# Patient Record
Sex: Male | Born: 1985 | Race: White | Hispanic: No | Marital: Single | Smoking: Never smoker
Health system: Southern US, Community
[De-identification: ages and names within clinical notes are randomized; demographics above are authoritative.]

## PROBLEM LIST (undated history)

## (undated) DIAGNOSIS — K7010 Alcoholic hepatitis without ascites: Secondary | ICD-10-CM

## (undated) DIAGNOSIS — F329 Major depressive disorder, single episode, unspecified: Secondary | ICD-10-CM

## (undated) DIAGNOSIS — R188 Other ascites: Secondary | ICD-10-CM

## (undated) DIAGNOSIS — R160 Hepatomegaly, not elsewhere classified: Secondary | ICD-10-CM

## (undated) DIAGNOSIS — F1020 Alcohol dependence, uncomplicated: Secondary | ICD-10-CM

---
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
## 2010-11-15 ENCOUNTER — Ambulatory Visit (HOSPITAL_BASED_OUTPATIENT_CLINIC_OR_DEPARTMENT_OTHER)
Admission: RE | Admit: 2010-11-15 | Discharge: 2010-11-15 | Disposition: A | Discharge status: Home / Self Care | Payer: 59 | Source: Ambulatory Visit | Attending: Orthopedic Surgery | Admitting: Orthopedic Surgery

## 2010-11-15 DIAGNOSIS — Y998 Other external cause status: Secondary | ICD-10-CM | POA: Insufficient documentation

## 2010-11-15 DIAGNOSIS — W19XXXA Unspecified fall, initial encounter: Secondary | ICD-10-CM | POA: Insufficient documentation

## 2010-11-15 DIAGNOSIS — Z01812 Encounter for preprocedural laboratory examination: Secondary | ICD-10-CM | POA: Insufficient documentation

## 2010-11-15 DIAGNOSIS — Y9374 Activity, frisbee: Secondary | ICD-10-CM | POA: Insufficient documentation

## 2010-11-15 DIAGNOSIS — S62339A Displaced fracture of neck of unspecified metacarpal bone, initial encounter for closed fracture: Secondary | ICD-10-CM | POA: Insufficient documentation

## 2010-11-15 HISTORY — PX: HAND SURGERY: SHX662

## 2010-11-15 LAB — POCT HEMOGLOBIN-HEMACUE: Hemoglobin: 18.3 g/dL — ABNORMAL HIGH (ref 13.0–17.0)

## 2010-11-16 NOTE — Op Note (Signed)
NAME:  [name], [name] NO.:  [id number]  MEDICAL RECORD NO.:  [id number]  LOCATION:                                 FACILITY:  PHYSICIAN:  Betha Loa, MD             DATE OF BIRTH:  DATE OF PROCEDURE:  11/15/2010 DATE OF DISCHARGE:                              OPERATIVE REPORT   PREOPERATIVE DIAGNOSIS:  Right small and ring finger metacarpal neck fractures.  POSTOPERATIVE DIAGNOSIS:  Right small and ring finger metacarpal neck fractures.  PROCEDURE:  Open reduction and percutaneous pinning of right small and ring finger metacarpal neck fracture.  SURGEON:  Betha Loa, MD  ASSISTANT:  Cindee Salt, MD  ANESTHESIA:  General with regional IV fluids per anesthesia flow sheet.  ESTIMATED BLOOD LOSS:  Minimal.  COMPLICATIONS:  None.  SPECIMENS:  None.  TOURNIQUET TIME:  88 minutes.  DISPOSITION:  Stable to PACU.  INDICATIONS:  Thomas Ross is a 25 year old male who 2 weeks ago was playing on ultimate frisbee when he fell on his hand injuring it.  He did seek immediate medical attention.  He presented to me yesterday in the office.  He had extensor lag at the small and ring finger MP joints. Radiographs revealed small and ring finger metacarpal neck fractures. There was volar angulation of these.  I discussed with Shamir and his mother the nature of his injury.  We discussed nonoperative treatment versus operative treatment with closed reduction and percutaneous pinning versus open reduction and internal fixation.  Risks, benefits, and alternatives of surgery were discussed and he wished to proceed. Risk of blood loss, infection, damage to nerves, vessels, tendons, ligaments, bone, failure of surgery, need for additional surgery, complications with wound healing, nonunion, malunion, stiffness.  He voiced understanding.  OPERATIVE COURSE:  After being identified preoperatively by myself, the patient and I agreed upon the procedure and site of procedure.   The surgical site was marked.  The risks, benefits, and alternatives of surgery were reviewed and he wished to proceed.  Surgical consent had been signed.  He had been given 1 gram of IV Ancef as preoperative antibiotic prophylaxis.  He was transferred to the operating room and placed on the operating table in supine position with the right upper extremity on an armboard.  General anesthesia was induced by anesthesiologist.  A regional block had been performed by Anesthesia in preoperative holding.  Right upper extremity was prepped and draped in normal sterile orthopedic fashion.  A surgical pause was performed between surgeons, Anesthesia, and operating staff and all were in agreement as to the patient, procedure, and site procedure.  Tourniquet proximal aspect of the extremity was inflated to 250 mmHg after exsanguination of the limb with an Esmarch bandage.  Closed reduction had been attempted after surgical pause, but prior to prepping and draping.  This was unable to gain any reduction.  A small incision was made between the ring and small finger metacarpals distally.  This was carried through subcutaneous tissues by spreading technique.  The extensor tendons were protected throughout the case.  They were not incised.  Periosteum was incised over the small finger metacarpal  neck distally.  This was then spread.  The fracture was freed up.  The same procedure was performed at the ring finger metacarpal neck.  Once the fractures had been freed up and we were able to be reduced under direct visualization using C-arm guidance, percutaneous pinning was performed. A 0.045-inch K-wires were used.  The ring finger was pinned first.  A cross-pinning fashion was used.  This was adequate to stabilize the ring finger metacarpal in adequate position.  Attention was turned to the small finger metacarpal.  This was again reduced.  It was difficult to hold reduced due to the obliquity of the  fracture.  Again, two 0.045- inch K-wires were used in a crossed fashion.  This was adequate to provide acceptable reduction.  The wrist was placed through tenodesis and there was no significant scissoring.  There had been noted to be some scarring between the extensor tendons and the periosteum.  This was freed up as well during the exposure.  The wound was copiously irrigated with sterile saline.  The periosteum was closed with 4-0 Vicryl in a figure-of-eight fashion.  The skin was closed with 4-0 nylon in a horizontal mattress fashion.  The pins were bent and cut short.  They were dressed with sterile Xeroform.  The wound was dressed with sterile Xeroform.  The wounds and pin sites were dressed with sterile 4x4s and wrapped with Kerlix.  A volar and dorsal slab splint was placed with the MPs flexed and IPs extended.  This was wrapped with Kerlix and Ace bandage.  The tourniquet was deflated at 88 minutes.  Fingertips were pink with brisk capillary refill after deflation of the tourniquet.  The operative drapes were broken down.  The patient was awoken from anesthesia safely.  He was transferred back to the stretcher and taken to PACU in stable condition.  I will see him back in the office in 1 week for postoperative followup.  I will give him a prescription for Percocet 5/325 one to two p.o. q.6 h. p.r.n. pain, dispensed #50.     Betha Loa, MD     KK/MEDQ  D:  11/15/2010  T:  11/15/2010  Job:  [id number]  Electronically Signed by Betha Loa  on 11/16/2010 02:50:06 PM

## 2014-09-04 DIAGNOSIS — F329 Major depressive disorder, single episode, unspecified: Secondary | ICD-10-CM

## 2014-09-04 DIAGNOSIS — K7010 Alcoholic hepatitis without ascites: Secondary | ICD-10-CM

## 2014-09-04 HISTORY — DX: Major depressive disorder, single episode, unspecified: F32.9

## 2014-09-04 HISTORY — DX: Alcoholic hepatitis without ascites: K70.10

## 2015-05-05 DIAGNOSIS — R188 Other ascites: Secondary | ICD-10-CM

## 2015-05-05 DIAGNOSIS — R160 Hepatomegaly, not elsewhere classified: Secondary | ICD-10-CM

## 2015-05-05 HISTORY — DX: Other ascites: R18.8

## 2015-05-05 HISTORY — DX: Hepatomegaly, not elsewhere classified: R16.0

## 2015-05-17 ENCOUNTER — Emergency Department (HOSPITAL_BASED_OUTPATIENT_CLINIC_OR_DEPARTMENT_OTHER): Payer: Self-pay

## 2015-05-17 ENCOUNTER — Encounter (HOSPITAL_BASED_OUTPATIENT_CLINIC_OR_DEPARTMENT_OTHER): Payer: Self-pay | Admitting: *Deleted

## 2015-05-17 ENCOUNTER — Inpatient Hospital Stay (HOSPITAL_BASED_OUTPATIENT_CLINIC_OR_DEPARTMENT_OTHER)
Admission: EM | Admit: 2015-05-17 | Discharge: 2015-05-21 | DRG: 433 | Disposition: A | Discharge status: Home / Self Care | Payer: Self-pay | Attending: Internal Medicine | Admitting: Internal Medicine

## 2015-05-17 DIAGNOSIS — B179 Acute viral hepatitis, unspecified: Secondary | ICD-10-CM | POA: Insufficient documentation

## 2015-05-17 DIAGNOSIS — D6489 Other specified anemias: Secondary | ICD-10-CM | POA: Diagnosis present

## 2015-05-17 DIAGNOSIS — R600 Localized edema: Secondary | ICD-10-CM | POA: Diagnosis present

## 2015-05-17 DIAGNOSIS — D689 Coagulation defect, unspecified: Secondary | ICD-10-CM | POA: Diagnosis present

## 2015-05-17 DIAGNOSIS — K7011 Alcoholic hepatitis with ascites: Principal | ICD-10-CM | POA: Diagnosis present

## 2015-05-17 DIAGNOSIS — Y900 Blood alcohol level of less than 20 mg/100 ml: Secondary | ICD-10-CM | POA: Diagnosis present

## 2015-05-17 DIAGNOSIS — R9431 Abnormal electrocardiogram [ECG] [EKG]: Secondary | ICD-10-CM | POA: Diagnosis present

## 2015-05-17 DIAGNOSIS — R601 Generalized edema: Secondary | ICD-10-CM

## 2015-05-17 DIAGNOSIS — I4581 Long QT syndrome: Secondary | ICD-10-CM

## 2015-05-17 DIAGNOSIS — F329 Major depressive disorder, single episode, unspecified: Secondary | ICD-10-CM | POA: Diagnosis present

## 2015-05-17 DIAGNOSIS — F1020 Alcohol dependence, uncomplicated: Secondary | ICD-10-CM | POA: Diagnosis present

## 2015-05-17 DIAGNOSIS — E871 Hypo-osmolality and hyponatremia: Secondary | ICD-10-CM | POA: Diagnosis present

## 2015-05-17 DIAGNOSIS — K7010 Alcoholic hepatitis without ascites: Secondary | ICD-10-CM | POA: Diagnosis present

## 2015-05-17 DIAGNOSIS — D72829 Elevated white blood cell count, unspecified: Secondary | ICD-10-CM | POA: Diagnosis present

## 2015-05-17 DIAGNOSIS — T502X5A Adverse effect of carbonic-anhydrase inhibitors, benzothiadiazides and other diuretics, initial encounter: Secondary | ICD-10-CM | POA: Diagnosis present

## 2015-05-17 DIAGNOSIS — K7460 Unspecified cirrhosis of liver: Secondary | ICD-10-CM | POA: Diagnosis present

## 2015-05-17 DIAGNOSIS — D649 Anemia, unspecified: Secondary | ICD-10-CM | POA: Diagnosis present

## 2015-05-17 DIAGNOSIS — E876 Hypokalemia: Secondary | ICD-10-CM

## 2015-05-17 DIAGNOSIS — R188 Other ascites: Secondary | ICD-10-CM | POA: Diagnosis present

## 2015-05-17 DIAGNOSIS — E8809 Other disorders of plasma-protein metabolism, not elsewhere classified: Secondary | ICD-10-CM | POA: Diagnosis present

## 2015-05-17 DIAGNOSIS — J9811 Atelectasis: Secondary | ICD-10-CM | POA: Diagnosis present

## 2015-05-17 HISTORY — DX: Major depressive disorder, single episode, unspecified: F32.9

## 2015-05-17 HISTORY — DX: Other ascites: R18.8

## 2015-05-17 HISTORY — DX: Alcohol dependence, uncomplicated: F10.20

## 2015-05-17 HISTORY — DX: Alcoholic hepatitis without ascites: K70.10

## 2015-05-17 HISTORY — DX: Hepatomegaly, not elsewhere classified: R16.0

## 2015-05-17 LAB — CBC WITH DIFFERENTIAL/PLATELET
BASOS ABS: 0 10*3/uL (ref 0.0–0.1)
Basophils Relative: 0 %
Eosinophils Absolute: 0 10*3/uL (ref 0.0–0.7)
Eosinophils Relative: 0 %
HEMATOCRIT: 37 % — AB (ref 39.0–52.0)
HEMOGLOBIN: 14.1 g/dL (ref 13.0–17.0)
LYMPHS PCT: 9 %
Lymphs Abs: 1.4 10*3/uL (ref 0.7–4.0)
MCH: 36.2 pg — ABNORMAL HIGH (ref 26.0–34.0)
MCHC: 38.1 g/dL — ABNORMAL HIGH (ref 30.0–36.0)
MCV: 94.9 fL (ref 78.0–100.0)
MONO ABS: 1.7 10*3/uL — AB (ref 0.1–1.0)
Monocytes Relative: 11 %
NEUTROS ABS: 12 10*3/uL — AB (ref 1.7–7.7)
NEUTROS PCT: 79 %
Platelets: 169 10*3/uL (ref 150–400)
RBC: 3.9 MIL/uL — AB (ref 4.22–5.81)
RDW: 13.7 % (ref 11.5–15.5)
WBC: 15.1 10*3/uL — ABNORMAL HIGH (ref 4.0–10.5)

## 2015-05-17 LAB — URINE MICROSCOPIC-ADD ON
RBC / HPF: NONE SEEN RBC/hpf (ref 0–5)
SQUAMOUS EPITHELIAL / LPF: NONE SEEN

## 2015-05-17 LAB — COMPREHENSIVE METABOLIC PANEL
ALK PHOS: 237 U/L — AB (ref 38–126)
ALT: 55 U/L (ref 17–63)
AST: 266 U/L — AB (ref 15–41)
Albumin: 2.5 g/dL — ABNORMAL LOW (ref 3.5–5.0)
Anion gap: 15 (ref 5–15)
BILIRUBIN TOTAL: 5.9 mg/dL — AB (ref 0.3–1.2)
CHLORIDE: 85 mmol/L — AB (ref 101–111)
CO2: 25 mmol/L (ref 22–32)
CREATININE: 0.36 mg/dL — AB (ref 0.61–1.24)
Calcium: 6.7 mg/dL — ABNORMAL LOW (ref 8.9–10.3)
Glucose, Bld: 140 mg/dL — ABNORMAL HIGH (ref 65–99)
Potassium: 2.2 mmol/L — CL (ref 3.5–5.1)
Sodium: 125 mmol/L — ABNORMAL LOW (ref 135–145)
Total Protein: 6.4 g/dL — ABNORMAL LOW (ref 6.5–8.1)

## 2015-05-17 LAB — URINALYSIS, ROUTINE W REFLEX MICROSCOPIC
Glucose, UA: NEGATIVE mg/dL
Hgb urine dipstick: NEGATIVE
KETONES UR: NEGATIVE mg/dL
NITRITE: NEGATIVE
Protein, ur: NEGATIVE mg/dL
Specific Gravity, Urine: 1.019 (ref 1.005–1.030)
pH: 6.5 (ref 5.0–8.0)

## 2015-05-17 LAB — PROTIME-INR
INR: 1.32 (ref 0.00–1.49)
Prothrombin Time: 16.5 seconds — ABNORMAL HIGH (ref 11.6–15.2)

## 2015-05-17 LAB — BRAIN NATRIURETIC PEPTIDE: B NATRIURETIC PEPTIDE 5: 166.1 pg/mL — AB (ref 0.0–100.0)

## 2015-05-17 LAB — MAGNESIUM: MAGNESIUM: 1.4 mg/dL — AB (ref 1.7–2.4)

## 2015-05-17 LAB — TROPONIN I: Troponin I: 0.03 ng/mL (ref <0.031)

## 2015-05-17 LAB — AMMONIA: AMMONIA: 101 umol/L — AB (ref 9–35)

## 2015-05-17 LAB — ETHANOL: ALCOHOL ETHYL (B): 5 mg/dL — AB (ref <5)

## 2015-05-17 MED ORDER — LORAZEPAM 1 MG PO TABS
0.0000 mg | ORAL_TABLET | Freq: Four times a day (QID) | ORAL | Status: AC
  Administered 2015-05-18 (×2): 1 mg via ORAL
  Filled 2015-05-17 (×2): qty 1

## 2015-05-17 MED ORDER — SODIUM CHLORIDE 0.9% FLUSH
3.0000 mL | Freq: Two times a day (BID) | INTRAVENOUS | Status: DC
  Administered 2015-05-18 – 2015-05-21 (×6): 3 mL via INTRAVENOUS

## 2015-05-17 MED ORDER — POLYETHYLENE GLYCOL 3350 17 G PO PACK: 17.0000 g | PACK | Freq: Every day | ORAL | Status: DC | PRN

## 2015-05-17 MED ORDER — THIAMINE HCL 100 MG/ML IJ SOLN
100.0000 mg | Freq: Every day | INTRAMUSCULAR | Status: DC
  Filled 2015-05-17 (×5): qty 1

## 2015-05-17 MED ORDER — VITAMIN B-1 100 MG PO TABS
100.0000 mg | ORAL_TABLET | Freq: Every day | ORAL | Status: DC
Start: 2015-05-17 — End: 2015-05-21
  Administered 2015-05-17 – 2015-05-21 (×5): 100 mg via ORAL
  Filled 2015-05-17 (×5): qty 1

## 2015-05-17 MED ORDER — SODIUM CHLORIDE 0.9 % IV SOLN
INTRAVENOUS | Status: DC
  Administered 2015-05-17: 16:00:00 via INTRAVENOUS

## 2015-05-17 MED ORDER — BISACODYL 5 MG PO TBEC: 5.0000 mg | DELAYED_RELEASE_TABLET | Freq: Every day | ORAL | Status: DC | PRN

## 2015-05-17 MED ORDER — ONDANSETRON HCL 4 MG/2ML IJ SOLN: 4.0000 mg | Freq: Four times a day (QID) | INTRAMUSCULAR | Status: DC | PRN

## 2015-05-17 MED ORDER — ONDANSETRON HCL 4 MG PO TABS: 4.0000 mg | ORAL_TABLET | Freq: Four times a day (QID) | ORAL | Status: DC | PRN

## 2015-05-17 MED ORDER — MAGNESIUM SULFATE 2 GM/50ML IV SOLN
2.0000 g | Freq: Once | INTRAVENOUS | Status: AC
  Administered 2015-05-17: 2 g via INTRAVENOUS
  Filled 2015-05-17: qty 50

## 2015-05-17 MED ORDER — LORAZEPAM 1 MG PO TABS: 1.0000 mg | ORAL_TABLET | Freq: Four times a day (QID) | ORAL | Status: AC | PRN

## 2015-05-17 MED ORDER — POTASSIUM CHLORIDE 10 MEQ/100ML IV SOLN
10.0000 meq | INTRAVENOUS | Status: AC
  Administered 2015-05-17 – 2015-05-18 (×4): 10 meq via INTRAVENOUS
  Filled 2015-05-17 (×3): qty 100

## 2015-05-17 MED ORDER — ACETAMINOPHEN 650 MG RE SUPP: 650.0000 mg | Freq: Four times a day (QID) | RECTAL | Status: DC | PRN

## 2015-05-17 MED ORDER — ADULT MULTIVITAMIN W/MINERALS CH
1.0000 | ORAL_TABLET | Freq: Every day | ORAL | Status: DC
  Administered 2015-05-17 – 2015-05-21 (×5): 1 via ORAL
  Filled 2015-05-17 (×5): qty 1

## 2015-05-17 MED ORDER — IOHEXOL 350 MG/ML SOLN
85.0000 mL | Freq: Once | INTRAVENOUS | Status: AC | PRN
  Administered 2015-05-17: 85 mL via INTRAVENOUS

## 2015-05-17 MED ORDER — LORAZEPAM 1 MG PO TABS: 0.0000 mg | ORAL_TABLET | Freq: Two times a day (BID) | ORAL | Status: DC

## 2015-05-17 MED ORDER — FUROSEMIDE 20 MG PO TABS
20.0000 mg | ORAL_TABLET | Freq: Once | ORAL | Status: AC
  Administered 2015-05-17: 20 mg via ORAL
  Filled 2015-05-17: qty 1

## 2015-05-17 MED ORDER — LORAZEPAM 2 MG/ML IJ SOLN
1.0000 mg | Freq: Four times a day (QID) | INTRAMUSCULAR | Status: AC | PRN
  Administered 2015-05-18: 1 mg via INTRAVENOUS
  Filled 2015-05-17: qty 1

## 2015-05-17 MED ORDER — OXYCODONE HCL 5 MG PO TABS
5.0000 mg | ORAL_TABLET | ORAL | Status: DC | PRN
  Administered 2015-05-17 – 2015-05-21 (×9): 5 mg via ORAL
  Filled 2015-05-17 (×9): qty 1

## 2015-05-17 MED ORDER — SPIRONOLACTONE 50 MG PO TABS
50.0000 mg | ORAL_TABLET | Freq: Once | ORAL | Status: AC
  Administered 2015-05-17: 50 mg via ORAL
  Filled 2015-05-17: qty 1

## 2015-05-17 MED ORDER — POTASSIUM CHLORIDE 10 MEQ/100ML IV SOLN
10.0000 meq | Freq: Once | INTRAVENOUS | Status: AC
  Administered 2015-05-17: 10 meq via INTRAVENOUS
  Filled 2015-05-17: qty 100

## 2015-05-17 MED ORDER — HYDROMORPHONE HCL 1 MG/ML IJ SOLN: 0.5000 mg | INTRAMUSCULAR | Status: DC | PRN

## 2015-05-17 MED ORDER — FOLIC ACID 1 MG PO TABS
1.0000 mg | ORAL_TABLET | Freq: Every day | ORAL | Status: DC
  Administered 2015-05-17 – 2015-05-21 (×5): 1 mg via ORAL
  Filled 2015-05-17 (×5): qty 1

## 2015-05-17 MED ORDER — ENOXAPARIN SODIUM 40 MG/0.4ML ~~LOC~~ SOLN
40.0000 mg | SUBCUTANEOUS | Status: DC
  Administered 2015-05-17 – 2015-05-20 (×4): 40 mg via SUBCUTANEOUS
  Filled 2015-05-17 (×4): qty 0.4

## 2015-05-17 MED ORDER — IOHEXOL 300 MG/ML  SOLN
25.0000 mL | Freq: Once | INTRAMUSCULAR | Status: AC | PRN
  Administered 2015-05-17: 25 mL via ORAL

## 2015-05-17 MED ORDER — FAMOTIDINE IN NACL 20-0.9 MG/50ML-% IV SOLN
20.0000 mg | Freq: Two times a day (BID) | INTRAVENOUS | Status: DC
  Administered 2015-05-17: 20 mg via INTRAVENOUS
  Filled 2015-05-17 (×2): qty 50

## 2015-05-17 MED ORDER — ACETAMINOPHEN 325 MG PO TABS: 650.0000 mg | ORAL_TABLET | Freq: Four times a day (QID) | ORAL | Status: DC | PRN

## 2015-05-17 NOTE — ED Notes (Signed)
[facility] is transferring patient to [facility] room [phone]

## 2015-05-17 NOTE — ED Notes (Signed)
Pt c/o burning at site. Rate decreased to 75 ml/hr.

## 2015-05-17 NOTE — ED Notes (Addendum)
Pt reports 4-5 days of bilateral leg swelling and abdominal swelling. Denies past medical hx. Tachycardic in triage. Feet and abdomen noticeably swollen

## 2015-05-17 NOTE — ED Notes (Signed)
Dr. Judd Lienelo updated on pt condition

## 2015-05-17 NOTE — Progress Notes (Signed)
[facility] of alcohol related hepatitis, LE swelling, abd distention Needs telemetry monitoring due to hypokalemia [facility] consulted  Eston EstersAlma DevineTRH [phone]

## 2015-05-17 NOTE — ED Provider Notes (Signed)
CSN: [id number]     Arrival date & time 05/17/15  1023 History   First MD Initiated Contact with Patient 05/17/15 1052     Chief Complaint  Patient presents with  . Leg Swelling     (Consider location/radiation/quality/duration/timing/severity/associated sxs/prior Treatment) HPI Comments: Patient is a 30 year old male with no significant past medical history. He presents today for evaluation of bilateral leg swelling, abdominal distention, and shortness of breath which has worsened over the past several days. He reports viral type symptoms preceding the onset of today's presentation. He states he was sick last week with GI like illness. He denies any chest pain. He denies any fevers or chills. He denies any changes in his bowel or bladder habits.  The history is provided by the patient.    History reviewed. No pertinent past medical history. Past Surgical History  Procedure Laterality Date  . Hand surgery     No family history on file. Social History  Substance Use Topics  . Smoking status: Never Smoker   . Smokeless tobacco: None  . Alcohol Use: No    Review of Systems  All other systems reviewed and are negative.     Allergies  Review of patient's allergies indicates no known allergies.  Home Medications   Prior to Admission medications   Not on File   BP 148/90 mmHg  Pulse 124  Temp(Src) 98.6 F (37 C) (Oral)  Resp 18  Ht  (1.854 m)  Wt 220 lb (99.791 kg)  BMI 29.03 kg/m2  SpO2 99% Physical Exam  Constitutional: He is oriented to person, place, and time. He appears well-developed and well-nourished. No distress.  HENT:  Head: Normocephalic and atraumatic.  Neck: Normal range of motion. Neck supple.  Cardiovascular: Regular rhythm and normal heart sounds.   No murmur heard. Heart is tachycardic, but regular.  Pulmonary/Chest: Effort normal. No respiratory distress. He has no wheezes. He has rales.  There are slight rales in the bases bilaterally.   Abdominal: Soft. Bowel sounds are normal. He exhibits distension. There is no tenderness. There is no rebound.  Abdomen feels somewhat distended. There is no tenderness to palpation.  Musculoskeletal: Normal range of motion. He exhibits edema.  There is 2-3+ pitting edema of the bilateral lower extremities. Pulses are easily palpable.  Neurological: He is alert and oriented to person, place, and time.  Skin: Skin is warm and dry. He is not diaphoretic.  Nursing note and vitals reviewed.   ED Course  Procedures (including critical care time) Labs Review Labs Reviewed  COMPREHENSIVE METABOLIC PANEL  TROPONIN I  BRAIN NATRIURETIC PEPTIDE  CBC WITH DIFFERENTIAL/PLATELET    Imaging Review No results found. I have personally reviewed and evaluated these images and lab results as part of my medical decision-making.   EKG Interpretation   Date/Time:  Monday May 17 2015 10:47:46 EDT Ventricular Rate:  120 PR Interval:  141 QRS Duration: 98 QT Interval:  361 QTC Calculation: 510 R Axis:   70 Text Interpretation:  Sinus tachycardia Prolonged QT interval Baseline  wander in lead(s) V6 Confirmed by Graysen Woodyard  MD, Gyneth Hubka ([id number]) on 05/17/2015  11:34:52 AM      MDM   Final diagnoses:  None    Patient with history of alcoholism. He presents with complaints of leg and abdominal swelling. His workup reveals an elevated bilirubin, hyponatremia, and hypo-hypokalemia. CT scan was obtained of the abdomen and pelvis which reveals an inflamed liver with narrowing of the IVC which appears to  be causing his edema and ascites. This finding was discussed with Jacki ConesLaurie from [facility]. She is recommending admission to the hospitalist service. Patient will be given potassium replacement and started on IV hydration to correct his hyponatremia. [facility].    Geoffery Lyonsouglas Delisia Mcquiston, MD 05/17/15 [phone]

## 2015-05-17 NOTE — H&P (Signed)
[facility] History and Physical  Thomas Ross WJX:[id number] DOB: September 11, 1985 DOA: 05/17/2015  Referring physician: ED physician PCP: No primary care provider on file.  Specialists:  None listed  Chief Complaint:  Abdominal and bilateral leg swelling   HPI: Thomas Ross is a 30 y.o. male with PMH of alcohol abuse who presents in transfer from [facility] with 4-5 days of bilateral lower extremity and abdominal swelling. Patient endorses a 7-8 year history of heavy alcohol use, typically consuming about 30 standard drinks per day. He had enrolled in an inpatient rehabilitation program approximately 6 months ago, but unfortunately returned to drinking. Blood work was obtained at the inpatient facility 6 months ago and the patient recalls being diagnosed with "mild cirrhosis." Patient denies any recent sick contacts, long distance travel, fever, or chills. He notes onset of loose stools approximately 3 days ago, but denies melena or hematochezia. He endorses mild, generalized abdominal tenderness, but denies pain per se. There's been no change in his urine; specifically, there is been no noted darkening or decreased output. He has experienced shaking, sweating, and anxiety when abstaining from alcohol, but denies any history of seizure. He denies use of intravenous drugs, or any other illicit substances. He denies personal or family history of viral hepatitis. Patient endorses a strong desire for achieving alcohol abstinence. He believes that his drinking is an attempt to manage undiagnosed depression.  In [facility] ED, patient was found toBe afebrile, saturating well on room air, tachycardic to the 120s, and with vitals otherwise stable. CMP is profoundly deranged, with sodium 125, potassium 2.2, albumin 2.5, and total bilirubin 5.9. Renal function appears normal. CBC features a leukocytosis to 15,100 and mild normocytic anemia with target cells identified on smear. BNP is elevated 266 and INR is 1.32. EKG  features a sinus tachycardia with rate of 120 and QT interval prolonged to 510 ms. Chest x-ray demonstrates bibasilar atelectasis. CT of the abdomen and pelvis features and enlarged, markedly heterogenous inflamed fatty liver. Liver inflammation is noted to cause narrowing of the hepatic course of the IVC. Patient was given 10 mEq of IV potassium in the emergency department and started on normal saline infusion. He was accepted in transfer to [facility] for ongoing evaluation and management.   Patient is interviewed and examined on the telemetry unit at [facility] with his mother at the bedside providing additional history. He will be admitted for ongoing evaluation and management of hepatitis, suspected to be alcoholic in origin and worsening acutely.    Where does patient live?   At home     Can patient participate in ADLs?  Yes       Review of Systems:   General: no fevers, chills, sweats, weight change, poor appetite, or fatigue HEENT: no blurry vision, hearing changes or sore throat Pulm: no dyspnea, cough, or wheeze CV: no chest pain or palpitations Abd: no vomiting or constipation. Nausea, mild abd pain, diarrhea GU: no dysuria, hematuria, increased urinary frequency, or urgency  Ext:  Bilateral LE edema  Neuro: no focal weakness, numbness, or tingling, no vision change or hearing loss Skin: no rash, no wounds MSK: No muscle spasm, no deformity, no red, hot, or swollen joint Heme: No easy bruising or bleeding Travel history: No recent long distant travel    Allergy: No Known Allergies  Past Medical History  Diagnosis Date  . Alcohol abuse     11/2014 30 can beer per day.    Past Surgical History  Procedure Laterality Date  .  Hand surgery      Social History:  reports that he has never smoked. He does not have any smokeless tobacco history on file. He reports that he does not drink alcohol or use illicit drugs.  Family History:  Family History  Problem Relation Age of  Onset  . Alcoholism Neg Hx      Prior to Admission medications   Medication Sig Start Date End Date Taking? Authorizing Provider  Bisacodyl (LAXATIVE PO) Take 1 tablet by mouth daily as needed (constipation).   Yes Historical Provider, MD  Chlorphen-Pseudoephed-APAP (THERAFLU FLU/COLD PO) Take 1 each by mouth every 8 (eight) hours as needed (cold symptoms).   Yes Historical Provider, MD    Physical Exam: Filed Vitals:   05/17/15 1420 05/17/15 1500 05/17/15 1542 05/17/15 1714  BP: 131/92 118/86 117/95 128/77  Pulse: 120 118 112 118  Temp:   98.5 F (36.9 C) 97.5 F (36.4 C)  TempSrc:   Oral Oral  Resp: Height:     (1.854 m)  Weight:    111.7 kg (246 lb 4.1 oz)  SpO2: 98% 98% 97% 99%   General: Not in acute distress, edematous HEENT:       Eyes: PERRL, EOMI, no conjunctival pallor. Icteric sclerae       ENT: No discharge from the ears or nose, no pharyngeal ulcers, petechiae or exudate, no tonsillar enlargement.        Neck: No JVD, no bruit, no appreciable mass Heme: No cervical adenopathy, no pallor Cardiac: Rate ~120 and regular with grade III holosystolic murmur at LSB, No gallops or rubs. Pulm: Good air movement bilaterally. No rales, wheezing, rhonchi or rubs. Abd: Soft, mild distension, mild tenderness, no rebound pain or gaurding, no mass or organomegaly, BS present, purple striae. Ext:  Bilateral LEs edematous to thigh. 2+DP/PT pulse bilaterally. Musculoskeletal: No gross deformity, no red, hot, swollen joints   Skin: No rashes or wounds on exposed surfaces  Neuro: Alert, oriented X3, cranial nerves II-XII grossly intact. No focal findings Psych: Patient is not overtly psychotic, appropriate mood and affect.  Labs on Admission:  Basic Metabolic Panel:  Recent Labs Lab 05/17/15 1105  NA 125*  K 2.2*  CL 85*  CO2 25  GLUCOSE 140*  BUN <5*  CREATININE 0.36*  CALCIUM 6.7*   Liver Function Tests:  Recent Labs Lab 05/17/15 1105  AST 266*   ALT 55  ALKPHOS 237*  BILITOT 5.9*  PROT 6.4*  ALBUMIN 2.5*   No results for input(s): LIPASE, AMYLASE in the last 168 hours. No results for input(s): AMMONIA in the last 168 hours. CBC:  Recent Labs Lab 05/17/15 1105  WBC 15.1*  NEUTROABS 12.0*  HGB 14.1  HCT 37.0*  MCV 94.9  PLT 169   Cardiac Enzymes:  Recent Labs Lab 05/17/15 1105  TROPONINI 0.03    BNP (last 3 results)  Recent Labs  05/17/15 1105  BNP 166.1*    ProBNP (last 3 results) No results for input(s): PROBNP in the last 8760 hours.  CBG: No results for input(s): GLUCAP in the last 168 hours.  Radiological Exams on Admission: Dg Chest 2 View  05/17/2015  CLINICAL DATA:  Abdominal bloating and shortness of breath for 5 days, some diarrhea EXAM: CHEST  2 VIEW COMPARISON:  None FINDINGS: Upper normal heart size. Mediastinal contours and pulmonary vascularity normal. Subsegmental atelectasis RIGHT base with mild eventration of the anterior RIGHT diaphragm. Minimal subsegmental atelectasis LEFT base. Upper  lungs clear. No pleural effusion or pneumothorax. IMPRESSION: Bibasilar atelectasis greater on RIGHT. Electronically Signed   By: Ulyses Southward M.D.   On: 05/17/2015 11:58   Ct Abdomen Pelvis W Contrast  05/17/2015  ADDENDUM REPORT: 05/17/2015 17:45 ADDENDUM: Adenopathy largest portacaval region with small lymph nodes gastrohepatic, peripancreatic and mesenteric region may be reactive in origin and can be assessed on followup liver MR after acute episode has been addressed. Electronically Signed   By: Lacy Duverney M.D.   On: 05/17/2015 17:45  05/17/2015  CLINICAL DATA:  30 year old male with 4-5 day history of abdominal swelling and foot swelling. Initial encounter. EXAM: CT ABDOMEN AND PELVIS WITH CONTRAST TECHNIQUE: Multidetector CT imaging of the abdomen and pelvis was performed using the standard protocol following bolus administration of intravenous contrast. CONTRAST:  25mL OMNIPAQUE IOHEXOL 300 MG/ML  SOLN, 85mL OMNIPAQUE IOHEXOL 350 MG/ML SOLN COMPARISON:  None. FINDINGS: Enlarged markedly heterogeneous inflamed fatty liver. Findings consistent with steatohepatitis of indeterminate etiology. This is causing and narrowing of the hepatic course of the inferior vena cava (primary Budd-Chiari syndrome felt to be unlikely as cause for this finding) contributing to the patient's leg swelling. Clinical and laboratory correlation recommended to determine if hepatitis is related to alcohol/ medication ingestion versus viral/inflammatory in origin. After acute episode has been addressed, patient would benefit from follow-up liver MR to exclude the possibly of underlying mass. Early cirrhosis not excluded given the configuration of the liver (caudate/ lateral left lobe enlargement). Ascites. This limits evaluation of bowel however, no primary bowel inflammatory process or free intraperitoneal air is noted. No cardiac enlargement to suggest cardiac cause of liver abnormality. No splenic enlargement. Main portal vein and splenic vein appear patent. No worrisome splenic, pancreatic, renal or adrenal abnormality. Evaluation of the gallbladder is limited by the surrounding ascites and liver inflammation. No acute osseous abnormality.  L5-S1 degenerative changes. Mild third spacing of fluid subcutaneous region. Lung bases clear. IMPRESSION: Enlarged markedly heterogeneous inflamed fatty liver. Findings consistent with steatohepatitis of indeterminate etiology. This is causing and narrowing of the hepatic course of the inferior vena cava contributing to the patient's leg swelling. Clinical and laboratory correlation recommended to determine if hepatitis is related to alcohol/ medication ingestion versus viral/inflammatory in origin. After acute episode has been addressed, patient would benefit from follow-up liver MR to exclude the possibly of underlying mass. Early cirrhosis not excluded given the configuration of the liver  (caudate/ lateral left lobe enlargement). Ascites. Evaluation of the gallbladder is limited by the surrounding ascites and liver inflammation. Mild third spacing of fluid subcutaneous region. Electronically Signed: By: Lacy Duverney M.D. On: 05/17/2015 14:00    EKG: Independently reviewed.  Abnormal findings:   Sinus tachycardia, rate 120, QTc 510 ms   Assessment/Plan  1. Alcoholic hepatitis  - Consuming ~30 drinks/day x7-8 yrs  - Pt reports blood work 6 mos ago, was told he had mild cirrhosis at that time  - Now presenting with total bili 5.9, INR 1.32, albumin 2.2, sodium 125, potassium 2.2, and anasarca  - Suspected progression of chronic alcoholic liver disease; superimposed acute viral hepatitis possible, will check viral Hep panel - Discussed with pt that abstinence is the cornerstone of treatment  - Maddrey discriminant function score is 27 on admission; will hold off on starting prednisolone unless worsens to 32 or more  - Correcting associated electrolyte derangements; check magnesium and replete prn   2. Alcoholism  - Pt reports consumption of ~30 drinks/day for 7-8 yrs  -  Entered inpt rehab ~6 mos ago but unfortunately, returned to drinking  - Pt cites depression as reason for drinking, is open to discussing this further, starting medication if indicated  - Endorses hx of sweats and shakes with abstinence, denies szr hx  - Monitor on CIWA with prn Ativan  - Social work consultation requested  3. Anasarca  - There is marked edema of LEs to thigh; ascites noted - Secondary to liver disease, albumin 2.5, low effective circulating arterial volume   - No significant pulmonary edema and no resp distress - Will aim for slow fluid removal to avoid precipitating renal insult  - After replacing potassium, will give spironolactone 50 mg and Lasix 20 mg PO x1  - Consider continuing spironolactone and Lasix at 100:40 ratio  - Follow renal function and electrolytes closely  - SLIV, daily  wts, strict I/Os, fluid-restrict diet, salt restrictions   4. Prolonged QTc interval  - Likely secondary to hypokalemia  - Expect resolution with K+ replacement  - Check mag level and replete prn  - Monitor on telemetry  - Avoid drugs that would further prolong    5. Depression - Pt cites this as reason for drinking  - He is willing to discuss further and start medications if indicated  - SW consultation requested for eval     DVT ppx:  SQ Lovenox      Code Status: Full code Family Communication:  Yes, patient's mother at bed side Disposition Plan: Admit to inpatient   Date of Service 05/17/2015    Briscoe Deutscherimothy S Arliss Hepburn, MD [facility] Pager [phone]  If 7PM-7AM, please contact night-coverage [email or web address] Password [email or web address] 05/17/2015, 7:29 PM

## 2015-05-18 ENCOUNTER — Encounter (HOSPITAL_COMMUNITY): Payer: Self-pay | Admitting: Physician Assistant

## 2015-05-18 DIAGNOSIS — R188 Other ascites: Secondary | ICD-10-CM

## 2015-05-18 DIAGNOSIS — R600 Localized edema: Secondary | ICD-10-CM

## 2015-05-18 LAB — CBC WITH DIFFERENTIAL/PLATELET
BASOS ABS: 0 10*3/uL (ref 0.0–0.1)
BASOS PCT: 0 %
EOS PCT: 0 %
Eosinophils Absolute: 0 10*3/uL (ref 0.0–0.7)
HCT: 37.8 % — ABNORMAL LOW (ref 39.0–52.0)
Hemoglobin: 13.3 g/dL (ref 13.0–17.0)
LYMPHS PCT: 13 %
Lymphs Abs: 1.7 10*3/uL (ref 0.7–4.0)
MCH: 35.3 pg — ABNORMAL HIGH (ref 26.0–34.0)
MCHC: 35.2 g/dL (ref 30.0–36.0)
MCV: 100.3 fL — AB (ref 78.0–100.0)
MONO ABS: 1.7 10*3/uL — AB (ref 0.1–1.0)
Monocytes Relative: 13 %
NEUTROS ABS: 9.4 10*3/uL — AB (ref 1.7–7.7)
Neutrophils Relative %: 74 %
PLATELETS: 158 10*3/uL (ref 150–400)
RBC: 3.77 MIL/uL — AB (ref 4.22–5.81)
RDW: 14.9 % (ref 11.5–15.5)
WBC: 12.8 10*3/uL — AB (ref 4.0–10.5)

## 2015-05-18 LAB — POTASSIUM: Potassium: 3.3 mmol/L — ABNORMAL LOW (ref 3.5–5.1)

## 2015-05-18 LAB — COMPREHENSIVE METABOLIC PANEL
ALBUMIN: 2.2 g/dL — AB (ref 3.5–5.0)
ALK PHOS: 212 U/L — AB (ref 38–126)
ALT: 51 U/L (ref 17–63)
ANION GAP: 13 (ref 5–15)
AST: 240 U/L — ABNORMAL HIGH (ref 15–41)
BILIRUBIN TOTAL: 6.9 mg/dL — AB (ref 0.3–1.2)
CALCIUM: 7 mg/dL — AB (ref 8.9–10.3)
CO2: 29 mmol/L (ref 22–32)
Chloride: 87 mmol/L — ABNORMAL LOW (ref 101–111)
Creatinine, Ser: 0.36 mg/dL — ABNORMAL LOW (ref 0.61–1.24)
GFR calc Af Amer: >60 mL/min (ref >60)
Glucose, Bld: 89 mg/dL (ref 65–99)
POTASSIUM: 2.4 mmol/L — AB (ref 3.5–5.1)
SODIUM: 129 mmol/L — AB (ref 135–145)
TOTAL PROTEIN: 5.8 g/dL — AB (ref 6.5–8.1)

## 2015-05-18 LAB — RAPID URINE DRUG SCREEN, HOSP PERFORMED
Amphetamines: NOT DETECTED
Barbiturates: NOT DETECTED
Benzodiazepines: NOT DETECTED
COCAINE: NOT DETECTED
OPIATES: NOT DETECTED
TETRAHYDROCANNABINOL: NOT DETECTED

## 2015-05-18 LAB — GLUCOSE, CAPILLARY: GLUCOSE-CAPILLARY: 90 mg/dL (ref 65–99)

## 2015-05-18 LAB — PROTIME-INR
INR: 1.44 (ref 0.00–1.49)
Prothrombin Time: 17.1 seconds — ABNORMAL HIGH (ref 11.6–15.2)

## 2015-05-18 LAB — HEPATITIS PANEL, ACUTE
HCV Ab: <0.1 s/co ratio (ref 0.0–0.9)
HEP B C IGM: NEGATIVE
Hep A IgM: NEGATIVE
Hepatitis B Surface Ag: NEGATIVE

## 2015-05-18 MED ORDER — SPIRONOLACTONE 100 MG PO TABS
100.0000 mg | ORAL_TABLET | Freq: Every day | ORAL | Status: DC
  Administered 2015-05-18 – 2015-05-21 (×4): 100 mg via ORAL
  Filled 2015-05-18 (×4): qty 1

## 2015-05-18 MED ORDER — LACTULOSE 10 GM/15ML PO SOLN
20.0000 g | Freq: Once | ORAL | Status: AC
  Administered 2015-05-18: 20 g via ORAL
  Filled 2015-05-18: qty 30

## 2015-05-18 MED ORDER — FUROSEMIDE 40 MG PO TABS
40.0000 mg | ORAL_TABLET | Freq: Every day | ORAL | Status: DC
  Administered 2015-05-18 – 2015-05-21 (×4): 40 mg via ORAL
  Filled 2015-05-18 (×4): qty 1

## 2015-05-18 MED ORDER — POTASSIUM CHLORIDE CRYS ER 20 MEQ PO TBCR
40.0000 meq | EXTENDED_RELEASE_TABLET | Freq: Once | ORAL | Status: AC
  Administered 2015-05-18: 40 meq via ORAL
  Filled 2015-05-18: qty 2

## 2015-05-18 MED ORDER — POTASSIUM CHLORIDE 20 MEQ/15ML (10%) PO SOLN
40.0000 meq | Freq: Once | ORAL | Status: AC
  Administered 2015-05-18: 40 meq via ORAL
  Filled 2015-05-18: qty 30

## 2015-05-18 MED ORDER — POTASSIUM CHLORIDE 10 MEQ/100ML IV SOLN
10.0000 meq | INTRAVENOUS | Status: AC
  Administered 2015-05-18 (×4): 10 meq via INTRAVENOUS
  Filled 2015-05-18 (×4): qty 100

## 2015-05-18 MED ORDER — FAMOTIDINE 20 MG PO TABS
20.0000 mg | ORAL_TABLET | Freq: Two times a day (BID) | ORAL | Status: DC
  Administered 2015-05-18 – 2015-05-21 (×7): 20 mg via ORAL
  Filled 2015-05-18 (×8): qty 1

## 2015-05-18 NOTE — Progress Notes (Signed)
[facility]                                                                              Patient Demographics  Thomas Ross, is a 30 y.o. male, DOB - 1985/11/10, ZOX:[id number]  Admit date - 05/17/2015   Admitting Physician Briscoe Deutscherimothy S Opyd, MD  Outpatient Primary MD for the patient is No primary care provider on file.  LOS - 1   Chief Complaint  Patient presents with  . Leg Swelling      Interim history 30 year old male with a history of alcohol abuse presented with bilateral lower chimney edema and abdominal swelling for approximately 1-2 weeks. Patient does endorse a heavy alcohol use for the last 7-8 years and drinks approximate 30 beers per day. Patient admitted for alcoholic hepatitis as well as anasarca. Gastroenterology consulted.  Assessment & Plan   Alcoholic hepatitis -Hepatitis panel -Maddrey score 27, no steroids at this time -Gastroenterology consulted and appreciated, recommended alcohol abstinence, slow diuresis, repeat abdominal imaging with MRI as an outpatient when acute issues have improved  Anasarca -Secondary to liver disease and hypoalbuminemia, albumin 2.5 -Monitor intake and output, daily weights -Continue fluid restriction and salt restriction -Continue to monitor renal function as patient will be getting diuretics -Daily Lasix and spironolactone  -CT abdomen and pelvis showed enlarged mildly heterogeneous inflamed fatty liver, findings consistent with steatohepatitis, which is causing a near weighing of the hepatic course of the NP vena cava contributed patient's leg swelling. MR of the liver recommended  Alcoholism -Patient counseled in cessation Depression -Patient states this is the reason for his drinking -Child psychotherapistocial worker consulted for evaluation and outpatient referral -Continue CIWA protocol, thiamine, folic acid, multivitamin  Hypokalemia/hypomagnesemia/Prolonged QTc interval -Likely secondary to hypokalemia -Continue to monitor  potassium and replace as needed -Magnesium 1.4, given replacement, continue to monitor  Hyponatremia -Mildly improving, continue to monitor CMP  Mild coagulopathy -Secondary to liver function -Continue to monitor   Elevated ammonia level -Without any confusion or asterixis at this time -Given lactulose and continue to monitor  Leukocytosis -Likely reactive.  Trending downward -Continue to monitor CBC  Code Status: Full  Family Communication: None at bedside  Disposition Plan: Admitted.  Time Spent in minutes   30 minutes  Procedures  None  Consults   Gastroenterology  DVT Prophylaxis  Lovenox  Lab Results  Component Value Date   PLT 158 05/18/2015    Medications  Scheduled Meds: . enoxaparin (LOVENOX) injection  40 mg Subcutaneous Q24H  . famotidine  20 mg Oral BID  . folic acid  1 mg Oral Daily  . furosemide  40 mg Oral Daily  . LORazepam  0-4 mg Oral Q6H   Followed by  . [START ON 05/19/2015] LORazepam  0-4 mg Oral Q12H  . multivitamin with minerals  1 tablet Oral Daily  . potassium chloride  40 mEq Oral Once  . sodium chloride flush  3 mL Intravenous Q12H  . spironolactone  100 mg Oral Daily  . thiamine  100 mg Oral Daily   Or  . thiamine  100 mg Intravenous Daily   Continuous Infusions:  PRN Meds:.bisacodyl, HYDROmorphone (DILAUDID) injection, LORazepam **OR** LORazepam, ondansetron **OR** ondansetron (  ZOFRAN) IV, oxyCODONE, polyethylene glycol  Antibiotics    Anti-infectives    None      Subjective:   Thomas Ross seen and examined today.  Patient denies chest pain, shortness of breath or abdominal pain.  Feels his swelling has decreased a bit.   Objective:   Filed Vitals:   05/18/15 0542 05/18/15 0544 05/18/15 0840 05/18/15 1312  BP: 136/90  133/79 130/86  Pulse: 110  117 100  Temp: 99 F (37.2 C)  98.2 F (36.8 C) 98 F (36.7 C)  TempSrc: Oral  Oral Oral  Resp: Height:      Weight:  111.5 kg (245 lb 13 oz)    SpO2:  100%  97% 98%    Wt Readings from Last 3 Encounters:  05/18/15 111.5 kg (245 lb 13 oz)     Intake/Output Summary (Last 24 hours) at 05/18/15 1445 Last data filed at 05/18/15 1221  Gross per 24 hour  Intake 1527.5 ml  Output   1300 ml  Net  227.5 ml    Exam  General: Well developed, well nourished, NAD, appears stated age  HEENT: NCAT, Scleral icterus, mucous membranes moist.   Cardiovascular: S1 S2 auscultated, tachycardic, 2/6SEM  Respiratory: Clear to auscultation bilaterally with equal chest rise  Abdomen: Tense, nontender, distended, + bowel sounds  Extremities: warm dry without cyanosis clubbing. +3LE edema extending to the thighs  Neuro: AAOx3, cranial nerves grossly intact. Strength 5/5 in patient's upper and lower extremities bilaterally  Skin: Without rashes exudates or nodules  Psych: appropriate mood and affect  Data Review   Micro Results No results found for this or any previous visit (from the past 240 hour(s)).  Radiology Reports Dg Chest 2 View  05/17/2015  CLINICAL DATA:  Abdominal bloating and shortness of breath for 5 days, some diarrhea EXAM: CHEST  2 VIEW COMPARISON:  None FINDINGS: Upper normal heart size. Mediastinal contours and pulmonary vascularity normal. Subsegmental atelectasis RIGHT base with mild eventration of the anterior RIGHT diaphragm. Minimal subsegmental atelectasis LEFT base. Upper lungs clear. No pleural effusion or pneumothorax. IMPRESSION: Bibasilar atelectasis greater on RIGHT. Electronically Signed   By: Ulyses Southward M.D.   On: 05/17/2015 11:58   Ct Abdomen Pelvis W Contrast  05/17/2015  ADDENDUM REPORT: 05/17/2015 17:45 ADDENDUM: Adenopathy largest portacaval region with small lymph nodes gastrohepatic, peripancreatic and mesenteric region may be reactive in origin and can be assessed on followup liver MR after acute episode has been addressed. Electronically Signed   By: Lacy Duverney M.D.   On: 05/17/2015 17:45  05/17/2015   CLINICAL DATA:  29 year old male with 4-5 day history of abdominal swelling and foot swelling. Initial encounter. EXAM: CT ABDOMEN AND PELVIS WITH CONTRAST TECHNIQUE: Multidetector CT imaging of the abdomen and pelvis was performed using the standard protocol following bolus administration of intravenous contrast. CONTRAST:  25mL OMNIPAQUE IOHEXOL 300 MG/ML SOLN, 85mL OMNIPAQUE IOHEXOL 350 MG/ML SOLN COMPARISON:  None. FINDINGS: Enlarged markedly heterogeneous inflamed fatty liver. Findings consistent with steatohepatitis of indeterminate etiology. This is causing and narrowing of the hepatic course of the inferior vena cava (primary Budd-Chiari syndrome felt to be unlikely as cause for this finding) contributing to the patient's leg swelling. Clinical and laboratory correlation recommended to determine if hepatitis is related to alcohol/ medication ingestion versus viral/inflammatory in origin. After acute episode has been addressed, patient would benefit from follow-up liver MR to exclude the possibly of underlying mass. Early cirrhosis not excluded given the  configuration of the liver (caudate/ lateral left lobe enlargement). Ascites. This limits evaluation of bowel however, no primary bowel inflammatory process or free intraperitoneal air is noted. No cardiac enlargement to suggest cardiac cause of liver abnormality. No splenic enlargement. Main portal vein and splenic vein appear patent. No worrisome splenic, pancreatic, renal or adrenal abnormality. Evaluation of the gallbladder is limited by the surrounding ascites and liver inflammation. No acute osseous abnormality.  L5-S1 degenerative changes. Mild third spacing of fluid subcutaneous region. Lung bases clear. IMPRESSION: Enlarged markedly heterogeneous inflamed fatty liver. Findings consistent with steatohepatitis of indeterminate etiology. This is causing and narrowing of the hepatic course of the inferior vena cava contributing to the patient's leg  swelling. Clinical and laboratory correlation recommended to determine if hepatitis is related to alcohol/ medication ingestion versus viral/inflammatory in origin. After acute episode has been addressed, patient would benefit from follow-up liver MR to exclude the possibly of underlying mass. Early cirrhosis not excluded given the configuration of the liver (caudate/ lateral left lobe enlargement). Ascites. Evaluation of the gallbladder is limited by the surrounding ascites and liver inflammation. Mild third spacing of fluid subcutaneous region. Electronically Signed: By: Lacy Duverney M.D. On: 05/17/2015 14:00    CBC  Recent Labs Lab 05/17/15 1105 05/18/15 0459  WBC 15.1* 12.8*  HGB 14.1 13.3  HCT 37.0* 37.8*  PLT 169 158  MCV 94.9 100.3*  MCH 36.2* 35.3*  MCHC 38.1* 35.2  RDW 13.7 14.9  LYMPHSABS 1.4 1.7  MONOABS 1.7* 1.7*  EOSABS 0.0 0.0  BASOSABS 0.0 0.0    Chemistries   Recent Labs Lab 05/17/15 1105 05/17/15 2048 05/18/15 0459 05/18/15 1335  NA 125*  --  129*  --   K 2.2*  --  2.4* 3.3*  CL 85*  --  87*  --   CO2 25  --  29  --   GLUCOSE 140*  --  89  --   BUN <5*  --  <5*  --   CREATININE 0.36*  --  0.36*  --   CALCIUM 6.7*  --  7.0*  --   MG  --  1.4*  --   --   AST 266*  --  240*  --   ALT 55  --  51  --   ALKPHOS 237*  --  212*  --   BILITOT 5.9*  --  6.9*  --    ------------------------------------------------------------------------------------------------------------------ estimated creatinine clearance is 181.5 mL/min (by C-G formula based on Cr of 0.36). ------------------------------------------------------------------------------------------------------------------ No results for input(s): HGBA1C in the last 72 hours. ------------------------------------------------------------------------------------------------------------------ No results for input(s): CHOL, HDL, LDLCALC, TRIG, CHOLHDL, LDLDIRECT in the last 72  hours. ------------------------------------------------------------------------------------------------------------------ No results for input(s): TSH, T4TOTAL, T3FREE, THYROIDAB in the last 72 hours.  Invalid input(s): FREET3 ------------------------------------------------------------------------------------------------------------------ No results for input(s): VITAMINB12, FOLATE, FERRITIN, TIBC, IRON, RETICCTPCT in the last 72 hours.  Coagulation profile  Recent Labs Lab 05/17/15 1525 05/18/15 0459  INR 1.32 1.44    No results for input(s): DDIMER in the last 72 hours.  Cardiac Enzymes  Recent Labs Lab 05/17/15 1105  TROPONINI 0.03   ------------------------------------------------------------------------------------------------------------------ Invalid input(s): POCBNP    Thomas Ross D.O. on 05/18/2015 at 2:45 PM  Between 7am to 7pm - Pager - [phone]  After 7pm go to [email or web address] - password [email or web address]  And look for the night coverage person covering for me after hours  [facility] Office  [phone]

## 2015-05-18 NOTE — Progress Notes (Signed)
CRITICAL VALUE ALERT  Critical value received:  Potassium: 2.4  Date of notification:  05/18/2015  Time of notification:  0619  Critical value read back:Yes.    Nurse who received alert:  Driscilla MoatsBriana Timberly Yott, RN  MD notified (1st page):  Merdis DelayK. Schorr, NP   Time of first page:  0600  MD notified (2nd page):  Time of second page:  Responding MD:  Merdis DelayK. Schorr, NP  Time MD responded:  [phone]

## 2015-05-18 NOTE — Progress Notes (Signed)
Key Points: Use following P&T approved IV to PO non-antibiotic change policy.  Description contains the criteria that are approved Note: Policy Excludes:  Esophagectomy patientsPHARMACIST - PHYSICIAN COMMUNICATION CONCERNING: IV to Oral Route Change Policy  RECOMMENDATION: This patient is receiving pepcid by the intravenous route.  Based on criteria approved by the Pharmacy and Therapeutics Committee, the intravenous medication(s) is/are being converted to the equivalent oral dose form(s).   DESCRIPTION: These criteria include:  The patient is eating (either orally or via tube) and/or has been taking other orally administered medications for a least 24 hours  The patient has no evidence of active gastrointestinal bleeding or impaired GI absorption (gastrectomy, short bowel, patient on TNA or NPO).  If you have questions about this conversion, please contact the Pharmacy Department  []   [phone]( [phone] )  [facility] []   [phone]( [phone] )  [facility]  []   [phone]( [phone] )  [facility] [x]   [phone]( [phone] )  [facility]  Earl ManyLegge, Marius GreenwoodMarshall, Summit Endoscopy CenterRPH 05/18/2015 8:32 AM

## 2015-05-18 NOTE — Consult Note (Signed)
Referring Provider: No ref. provider found Primary Care Physician:  No primary care provider on file. Primary Gastroenterologist:  None, unassigned.  Reason for Consultation:  ETOH hepatitis  HPI: Thomas Ross is a 30 y.o. male with PMH of alcohol abuse who presents in transfer from [facility] with 4-5 days of bilateral lower extremity and abdominal swelling. Patient endorses a 7-8 year history of heavy alcohol use, typically consuming about 30 standard drinks per day (beer). He had enrolled in an inpatient rehabilitation program approximately 6 months ago, but unfortunately returned to drinking. Blood work was obtained at the inpatient facility 6 months ago and the patient recalls being diagnosed with "mild cirrhosis".  Patient denies any recent sick contacts, long distance travel, fever, or chills.  Says that his stools have been dark brown, but no black stool; no hematochezia.  He says that he had some vomiting a couple of weeks ago, which he thinks what from withdrawal and he did see some blood on one occasion.  He endorses mild, generalized abdominal tenderness/tightness, but denies pain per se.  No confusion although ammonia was elevated at 101.  He has experienced shaking, sweating, and anxiety when abstaining from alcohol, but denies any history of seizure. He denies use of intravenous drugs, or any other illicit substances. He denies personal or family history of viral hepatitis. Patient endorses a strong desire for achieving alcohol abstinence. He believes that his drinking is an attempt to manage undiagnosed depression.  In [facility] ED, patient was found to be afebrile, saturating well on room air, tachycardic to the 120s, and with vitals otherwise stable. CMP is profoundly deranged, with sodium 125, potassium 2.2, albumin 2.5, and total bilirubin 5.9. Renal function was normal. CBC features a leukocytosis to 15,100 and mild normocytic anemia with target cells identified on smear. BNP is elevated 266  and INR is 1.32. EKG features a sinus tachycardia with rate of 120 and QT interval prolonged to 510 ms. Chest x-ray demonstrates bibasilar atelectasis. CT of the abdomen and pelvis with contrast as stated below.  Patient was given 10 mEq of IV potassium in the emergency department and started on normal saline infusion. He was accepted in transfer to [facility] for ongoing evaluation and management.   CT scan abdomen and pelvis as follows:  "IMPRESSION: Enlarged markedly heterogeneous inflamed fatty liver. Findings consistent with steatohepatitis of indeterminate etiology. This is causing and narrowing of the hepatic course of the inferior vena cava contributing to the patient's leg swelling. Clinical and laboratory correlation recommended to determine if hepatitis is related to alcohol/ medication ingestion versus viral/inflammatory in origin. After acute episode has been addressed, patient would benefit from follow-up liver MR to exclude the possibly of underlying mass. Early cirrhosis not excluded given the configuration of the liver (caudate/ lateral left lobe enlargement).  Ascites.  Evaluation of the gallbladder is limited by the surrounding ascites and liver inflammation.  Mild third spacing of fluid subcutaneous region."  Feel a little better this AM, but potassium and sodium still low at 2.4 and 129, respectively.  Total bili up slightly at 6.9,  AST stable at 240, ALT normal at 51, and ALP stable at 212.  INR 1.44.  WBC count down to 12.8.  Hgb normal/stable, platelets normal/stable.   Past Medical History  Diagnosis Date  . Alcohol abuse     11/2014 30 can beer per day.    Past Surgical History  Procedure Laterality Date  . Hand surgery      Prior  to Admission medications   Medication Sig Start Date End Date Taking? Authorizing Provider  Bisacodyl (LAXATIVE PO) Take 1 tablet by mouth daily as needed (constipation).   Yes Historical Provider, MD    Chlorphen-Pseudoephed-APAP (THERAFLU FLU/COLD PO) Take 1 each by mouth every 8 (eight) hours as needed (cold symptoms).   Yes Historical Provider, MD    Current Facility-Administered Medications  Medication Dose Route Frequency Provider Last Rate Last Dose  . acetaminophen (TYLENOL) tablet 650 mg  650 mg Oral Q6H PRN Briscoe Deutscherimothy S Opyd, MD       Or  . acetaminophen (TYLENOL) suppository 650 mg  650 mg Rectal Q6H PRN Briscoe Deutscherimothy S Opyd, MD      . bisacodyl (DULCOLAX) EC tablet 5 mg  5 mg Oral Daily PRN Briscoe Deutscherimothy S Opyd, MD      . enoxaparin (LOVENOX) injection 40 mg  40 mg Subcutaneous Q24H Briscoe Deutscherimothy S Opyd, MD   40 mg at 05/17/15 2115  . famotidine (PEPCID) tablet 20 mg  20 mg Oral BID Hessie KnowsJustin M Legge, [facility] HospitalRPH      . folic acid (FOLVITE) tablet 1 mg  1 mg Oral Daily Briscoe Deutscherimothy S Opyd, MD   1 mg at 05/18/15 0848  . HYDROmorphone (DILAUDID) injection 0.5 mg  0.5 mg Intravenous Q2H PRN Lavone Neriimothy S Opyd, MD      . LORazepam (ATIVAN) tablet 1 mg  1 mg Oral Q6H PRN Briscoe Deutscherimothy S Opyd, MD       Or  . LORazepam (ATIVAN) injection 1 mg  1 mg Intravenous Q6H PRN Briscoe Deutscherimothy S Opyd, MD   1 mg at 05/18/15 0130  . LORazepam (ATIVAN) tablet 0-4 mg  0-4 mg Oral Q6H Briscoe Deutscherimothy S Opyd, MD   0 mg at 05/17/15 2034   Followed by  . [START ON 05/19/2015] LORazepam (ATIVAN) tablet 0-4 mg  0-4 mg Oral Q12H Lavone Neriimothy S Opyd, MD      . multivitamin with minerals tablet 1 tablet  1 tablet Oral Daily Briscoe Deutscherimothy S Opyd, MD   1 tablet at 05/18/15 0848  . ondansetron (ZOFRAN) tablet 4 mg  4 mg Oral Q6H PRN Briscoe Deutscherimothy S Opyd, MD       Or  . ondansetron (ZOFRAN) injection 4 mg  4 mg Intravenous Q6H PRN Briscoe Deutscherimothy S Opyd, MD      . oxyCODONE (Oxy IR/ROXICODONE) immediate release tablet 5 mg  5 mg Oral Q4H PRN Briscoe Deutscherimothy S Opyd, MD   5 mg at 05/18/15 0811  . polyethylene glycol (MIRALAX / GLYCOLAX) packet 17 g  17 g Oral Daily PRN Lavone Neriimothy S Opyd, MD      . potassium chloride 10 mEq in 100 mL IVPB  10 mEq Intravenous Q1 Hr x 4 Roma KayserKatherine P Schorr, NP   10 mEq at 05/18/15  0847  . sodium chloride flush (NS) 0.9 % injection 3 mL  3 mL Intravenous Q12H Briscoe Deutscherimothy S Opyd, MD   3 mL at 05/17/15 2116  . thiamine (VITAMIN B-1) tablet 100 mg  100 mg Oral Daily Briscoe Deutscherimothy S Opyd, MD   100 mg at 05/18/15 0848   Or  . thiamine (B-1) injection 100 mg  100 mg Intravenous Daily Briscoe Deutscherimothy S Opyd, MD        Allergies as of 05/17/2015  . (No Known Allergies)    Family History  Problem Relation Age of Onset  . Alcoholism Neg Hx     Social History   Social History  . Marital Status: Single    Spouse Name:  N/A  . Number of Children: N/A  . Years of Education: N/A   Occupational History  . Not on file.   Social History Main Topics  . Smoking status: Never Smoker   . Smokeless tobacco: Not on file  . Alcohol Use: No  . Drug Use: No  . Sexual Activity: Not on file   Other Topics Concern  . Not on file   Social History Narrative  . No narrative on file    Review of Systems: Ten point ROS is O/W negative except as mentioned in HPI.  Physical Exam: Vital signs in last 24 hours: Temp:  [97.5 F (36.4 C)-99 F (37.2 C)] 98.2 F (36.8 C) (03/14 0840) Pulse Rate:  [110-124] 117 (03/14 0840) Resp:  [18-26] 24 (03/14 0840) BP: (117-148)/(77-95) 133/79 mmHg (03/14 0840) SpO2:  [97 %-100 %] 97 % (03/14 0840) Weight:  [220 lb (99.791 kg)-246 lb 4.1 oz (111.7 kg)] 245 lb 13 oz (111.5 kg) (03/14 0544) Last BM Date: 05/17/15 General:  Alert, Well-developed, well-nourished, pleasant and cooperative in NAD Head:  Normocephalic and atraumatic. Eyes:  Scleral icterus noted. Ears:  Normal auditory acuity. Mouth:  No deformity or lesions.   Lungs:  Clear throughout to auscultation.  No wheezes, crackles, or rhonchi.  Heart:  Tachy.  No M/R/G. Abdomen:  Somewhat tense/distended from ascites fluid.  BS present.  Non-tender.   Rectal:  Deferred  Msk:  Symmetrical without gross deformities. Pulses:  Normal pulses noted. Extremities:  Significant swelling in B/L LE's up to  the level of his thighs. Neurologic:  Alert and oriented x 4;  grossly normal neurologically.  No asterixis noted. Skin:  Intact without significant lesions or rashes. Psych:  Alert and cooperative. Normal mood and affect.  Intake/Output from previous day: 03/13 0701 - 03/14 0700 In: 1427.5 [P.O.:720; I.V.:257.5; IV Piggyback:450] Out: 750 [Urine:750] Intake/Output this shift: Total I/O In: 100 [IV Piggyback:100] Out: 125 [Urine:125]  Lab Results:  Recent Labs  05/17/15 1105 05/18/15 0459  WBC 15.1* 12.8*  HGB 14.1 13.3  HCT 37.0* 37.8*  PLT 169 158   BMET  Recent Labs  05/17/15 1105 05/18/15 0459  NA 125* 129*  K 2.2* 2.4*  CL 85* 87*  CO2 25 29  GLUCOSE 140* 89  BUN <5* <5*  CREATININE 0.36* 0.36*  CALCIUM 6.7* 7.0*   LFT  Recent Labs  05/18/15 0459  PROT 5.8*  ALBUMIN 2.2*  AST 240*  ALT 51  ALKPHOS 212*  BILITOT 6.9*   PT/INR  Recent Labs  05/17/15 1525 05/18/15 0459  LABPROT 16.5* 17.1*  INR 1.32 1.44   Hepatitis Panel  Recent Labs  05/17/15 1525  HEPBSAG Negative  HCVAB <0.1  HEPAIGM Negative  HEPBIGM Negative   Studies/Results: Dg Chest 2 View  05/17/2015  CLINICAL DATA:  Abdominal bloating and shortness of breath for 5 days, some diarrhea EXAM: CHEST  2 VIEW COMPARISON:  None FINDINGS: Upper normal heart size. Mediastinal contours and pulmonary vascularity normal. Subsegmental atelectasis RIGHT base with mild eventration of the anterior RIGHT diaphragm. Minimal subsegmental atelectasis LEFT base. Upper lungs clear. No pleural effusion or pneumothorax. IMPRESSION: Bibasilar atelectasis greater on RIGHT. Electronically Signed   By: Ulyses Southward M.D.   On: 05/17/2015 11:58   Ct Abdomen Pelvis W Contrast  05/17/2015  ADDENDUM REPORT: 05/17/2015 17:45 ADDENDUM: Adenopathy largest portacaval region with small lymph nodes gastrohepatic, peripancreatic and mesenteric region may be reactive in origin and can be assessed on followup liver MR  after  acute episode has been addressed. Electronically Signed   By: Lacy Duverney M.D.   On: 05/17/2015 17:45  05/17/2015  CLINICAL DATA:  30 year old male with 4-5 day history of abdominal swelling and foot swelling. Initial encounter. EXAM: CT ABDOMEN AND PELVIS WITH CONTRAST TECHNIQUE: Multidetector CT imaging of the abdomen and pelvis was performed using the standard protocol following bolus administration of intravenous contrast. CONTRAST:  25mL OMNIPAQUE IOHEXOL 300 MG/ML SOLN, 85mL OMNIPAQUE IOHEXOL 350 MG/ML SOLN COMPARISON:  None. FINDINGS: Enlarged markedly heterogeneous inflamed fatty liver. Findings consistent with steatohepatitis of indeterminate etiology. This is causing and narrowing of the hepatic course of the inferior vena cava (primary Budd-Chiari syndrome felt to be unlikely as cause for this finding) contributing to the patient's leg swelling. Clinical and laboratory correlation recommended to determine if hepatitis is related to alcohol/ medication ingestion versus viral/inflammatory in origin. After acute episode has been addressed, patient would benefit from follow-up liver MR to exclude the possibly of underlying mass. Early cirrhosis not excluded given the configuration of the liver (caudate/ lateral left lobe enlargement). Ascites. This limits evaluation of bowel however, no primary bowel inflammatory process or free intraperitoneal air is noted. No cardiac enlargement to suggest cardiac cause of liver abnormality. No splenic enlargement. Main portal vein and splenic vein appear patent. No worrisome splenic, pancreatic, renal or adrenal abnormality. Evaluation of the gallbladder is limited by the surrounding ascites and liver inflammation. No acute osseous abnormality.  L5-S1 degenerative changes. Mild third spacing of fluid subcutaneous region. Lung bases clear. IMPRESSION: Enlarged markedly heterogeneous inflamed fatty liver. Findings consistent with steatohepatitis of indeterminate  etiology. This is causing and narrowing of the hepatic course of the inferior vena cava contributing to the patient's leg swelling. Clinical and laboratory correlation recommended to determine if hepatitis is related to alcohol/ medication ingestion versus viral/inflammatory in origin. After acute episode has been addressed, patient would benefit from follow-up liver MR to exclude the possibly of underlying mass. Early cirrhosis not excluded given the configuration of the liver (caudate/ lateral left lobe enlargement). Ascites. Evaluation of the gallbladder is limited by the surrounding ascites and liver inflammation. Mild third spacing of fluid subcutaneous region. Electronically Signed: By: Lacy Duverney M.D. On: 05/17/2015 14:00   IMPRESSION:  *Acute ETOH hepatitis:  With mild coagulopathy, elevated ammonia (no confusion or asterixis), ascites, compression on IVC from hepatomegaly causing B/L LE edema.  DF not high enough for steroids.  Acute hepatitis panel also pending but no doubt this is from ETOH use. *Hypokalemia:  2.4 this AM.  Being replaced by primary service. *Hyponatremia:  Improved slightly this AM but still low. *Episode of scant/transient hematemesis:  Reports one episode of this 2-3 weeks ago.  ?MWT vs portal hypertensive gastropathy, etc.  Is on pepcid 20 mg PO BID currently.  Hgb normal/stable. *ETOH abuse:  Needs abstinence, has strong desire to do this.  On CIWA protocol here with no sign of withdrawal so far.   PLAN: -Replace electrolytes; monitor these as well as PT/INR, LFT's, renal function, weights, I/Os. -Low sodium diet. -Slow diuresis once electrolytes corrected. -ETOH abstinence.   -Will need repeat abdominal imaging with MRI as outpatient once acute issue has improved.  ZEHR, JESSICA D.  05/18/2015, 9:02 AM  Pager number [phone]     ________________________________________________________________________  [facility] MD note:  I personally examined the  patient, reviewed the data and agree with the assessment and plan described above.  I'm going to start usual dose diuretics (aldactone 100 daily, lasix  40 daily) for 2+ pitting edema and mild ascites.  Hepatitis discriminant function is under the threshold for starting steroids currently but will recalculate it periodically (based on INR, T bili).  Obviously, it is most important that he not resume alcohol drinking.  I explained it could be fatal if he restarts.   Rob Bunting, MD [facility] Gastroenterology Pager [phone]

## 2015-05-19 DIAGNOSIS — J9811 Atelectasis: Secondary | ICD-10-CM | POA: Diagnosis present

## 2015-05-19 DIAGNOSIS — K7011 Alcoholic hepatitis with ascites: Principal | ICD-10-CM

## 2015-05-19 DIAGNOSIS — D72829 Elevated white blood cell count, unspecified: Secondary | ICD-10-CM | POA: Diagnosis present

## 2015-05-19 DIAGNOSIS — F1020 Alcohol dependence, uncomplicated: Secondary | ICD-10-CM

## 2015-05-19 LAB — COMPREHENSIVE METABOLIC PANEL
ALT: 47 U/L (ref 17–63)
ANION GAP: 12 (ref 5–15)
AST: 226 U/L — ABNORMAL HIGH (ref 15–41)
Albumin: 2.5 g/dL — ABNORMAL LOW (ref 3.5–5.0)
Alkaline Phosphatase: 227 U/L — ABNORMAL HIGH (ref 38–126)
BUN: <5 mg/dL — ABNORMAL LOW (ref 6–20)
CHLORIDE: 92 mmol/L — AB (ref 101–111)
CO2: 29 mmol/L (ref 22–32)
Calcium: 7.4 mg/dL — ABNORMAL LOW (ref 8.9–10.3)
Creatinine, Ser: 0.56 mg/dL — ABNORMAL LOW (ref 0.61–1.24)
GFR calc non Af Amer: >60 mL/min (ref >60)
Glucose, Bld: 81 mg/dL (ref 65–99)
POTASSIUM: 3.4 mmol/L — AB (ref 3.5–5.1)
SODIUM: 133 mmol/L — AB (ref 135–145)
Total Bilirubin: 7.2 mg/dL — ABNORMAL HIGH (ref 0.3–1.2)
Total Protein: 6.2 g/dL — ABNORMAL LOW (ref 6.5–8.1)

## 2015-05-19 LAB — URINE CULTURE: Culture: NO GROWTH

## 2015-05-19 LAB — HEPATITIS PANEL, ACUTE
HEP A IGM: NEGATIVE
HEP B C IGM: NEGATIVE
HEP B S AG: NEGATIVE

## 2015-05-19 LAB — CBC
HCT: 40.5 % (ref 39.0–52.0)
HEMOGLOBIN: 14.3 g/dL (ref 13.0–17.0)
MCH: 36.6 pg — ABNORMAL HIGH (ref 26.0–34.0)
MCHC: 35.3 g/dL (ref 30.0–36.0)
MCV: 103.6 fL — ABNORMAL HIGH (ref 78.0–100.0)
PLATELETS: 178 10*3/uL (ref 150–400)
RBC: 3.91 MIL/uL — AB (ref 4.22–5.81)
RDW: 15.7 % — ABNORMAL HIGH (ref 11.5–15.5)
WBC: 16 10*3/uL — AB (ref 4.0–10.5)

## 2015-05-19 LAB — GLUCOSE, CAPILLARY: GLUCOSE-CAPILLARY: 69 mg/dL (ref 65–99)

## 2015-05-19 LAB — PROTIME-INR
INR: 1.41 (ref 0.00–1.49)
PROTHROMBIN TIME: 17.4 s — AB (ref 11.6–15.2)

## 2015-05-19 LAB — MAGNESIUM: MAGNESIUM: 1.9 mg/dL (ref 1.7–2.4)

## 2015-05-19 MED ORDER — MAGNESIUM SULFATE 2 GM/50ML IV SOLN
2.0000 g | Freq: Once | INTRAVENOUS | Status: AC
  Administered 2015-05-19: 2 g via INTRAVENOUS
  Filled 2015-05-19: qty 50

## 2015-05-19 NOTE — Progress Notes (Signed)
Initial Nutrition Assessment  DOCUMENTATION CODES:   Obesity unspecified  INTERVENTION:  - Continue to encourage good intakes at meals - RD will continue to monitor for needs  NUTRITION DIAGNOSIS:   Inadequate oral intake related to social / environmental circumstances as evidenced by per patient/family report.  GOAL:   Patient will meet greater than or equal to 90% of their needs  MONITOR:   PO intake, Weight trends, Labs, I & O's  REASON FOR ASSESSMENT:   Consult Assessment of nutrition requirement/status  ASSESSMENT:   30 y.o. male with PMH of alcohol abuse who presents in transfer from [facility] with 4-5 days of bilateral lower extremity and abdominal swelling. Patient endorses a 7-8 year history of heavy alcohol use, typically consuming about 30 standard drinks per day. He had enrolled in an inpatient rehabilitation program approximately 6 months ago, but unfortunately returned to drinking. Blood work was obtained at the inpatient facility 6 months ago and the patient recalls being diagnosed with "mild cirrhosis." Patient denies any recent sick contacts, long distance travel, fever, or chills. He notes onset of loose stools approximately 3 days ago, but denies melena or hematochezia  Pt seen for consult. BMI indicates obesity. No intakes documented since admission. Visualized lunch tray with 75% completion. Pt states that he has been eating well since admission and denies abdominal pain or nausea with any intakes. He initially states that PTA he was only drinking, not eating. He later states that he would eat a little bit each day but was mainly drinking. Notes indicate pt with strong desire for alcohol cessation. Pt states that over the past 4 months he has gained ~17 lbs and he attributes this to drinking habits as he began drinking heavily around that time.   Physical assessment shows no muscle or fat wasting, moderate edema present with MD note indicating anasarca secondary to  liver disease and hypoalbuminemia. Unsure if pt is at risk for refeeding given poor intakes PTA. Per rounds yesterday (3/14), pt was previously given runs of K.   Pt not meeting needs PTA but likely to meet needs during admission with continued good intakes. 1500cc fluid restriction in place. Medications reviewed; 1 mg oral folic acid daily, 40 mg oral Lasix daily, daily multivitamin with minerals,100 mg thiamine oral or IV daily. Labs reviewed; Na: 133 mmol/L, K: 3.4 mmol/L, creatinine low, Ca: 7.4 mg/dL, Alk Phos and AST elevated with ALT WDL.   Diet Order:  Diet 2 gram sodium Room service appropriate?: Yes; Fluid consistency:: Thin; Fluid restriction:: 1500 mL Fluid  Skin:  Reviewed, no issues  Last BM:  3/15  Height:   Ht Readings from Last 1 Encounters:  05/17/15 '6\' 1"'$  (1.854 m)    Weight:   Wt Readings from Last 1 Encounters:  05/19/15 245 lb 9.5 oz (111.4 kg)    Ideal Body Weight:  83.64 kg (kg)  BMI:  Body mass index is 32.41 kg/(m^2).  Estimated Nutritional Needs:   Kcal:  2100-2300  Protein:  110-120 grams  Fluid:  1.7-2 L/day  EDUCATION NEEDS:   No education needs identified at this time     Jarome Matin, RD, LDN Inpatient Clinical Dietitian Pager # [phone] After hours/weekend pager # [phone]

## 2015-05-19 NOTE — Progress Notes (Signed)
[facility] PROGRESS NOTE  Thomas GunningJustin Ross [id number] DOB: 08-19-1985 DOA: 05/17/2015 PCP: No primary care provider on file.  Summary 05/19/15: I have seen and examined Mr. Thomas Ross at bedside and reviewed his chart. Appreciate GI.  Thomas GunningJustin Ross is a pleasant  30 year-old male with a history of alcohol abuse who  presented with bilateral lower lower extremity edema and abdominal swelling for approximately 1-2 weeks and he was found to have acute alcoholic hepatitis with anasarca. Patient does endorse a heavy alcohol use for the last 7-8 years and drinks approximate 30 beers per day. Gastroenterology has recommended monitoring patient in house to ensure liver function improves. His white count keeps increasing and this is concerning. He has bibasilar atelectasis with no evidence of infection. We'll therefore continue to monitor. He denies any complaints. There is no evidence of alcohol withdrawal. Will follow GI recommendations. Plan Alcoholic hepatitis/Alcoholism (HCC)/Anasarca/Ascites/Bilateral lower extremity edema  Continue management per GI  CIWA protocol Atelectasis/Leucocytosis  Incentive spirometry  Consider antibiotics if white count continues to increase   Hypokalemia/hypomagnesemia  Replenish electrolytes as needed and  Code Status: Full code Family Communication: None at bedside Disposition Plan: Home when clinically ready   Consultants:  GI  Procedures:    Antibiotics:  None  HPI/Subjective: Complains of abdominal fullness.  Objective: Filed Vitals:   05/19/15 0521 05/19/15 1403  BP: 130/84 126/71  Pulse: 115 117  Temp: 98.4 F (36.9 C) 98.1 F (36.7 C)  Resp: 20 20    Intake/Output Summary (Last 24 hours) at 05/19/15 2019 Last data filed at 05/19/15 0700  Gross per 24 hour  Intake    720 ml  Output    300 ml  Net    420 ml   Filed Weights   05/17/15 1714 05/18/15 0544 05/19/15 0521  Weight: 111.7 kg (246 lb 4.1 oz) 111.5 kg (245 lb 13 oz)  111.4 kg (245 lb 9.5 oz)    Exam:   General:  Comfortable at rest.  Cardiovascular: S1-S2 normal. No murmurs. Pulse regular.  Respiratory: Good air entry bilaterally. No rhonchi or rales.  Abdomen: Soft and nontender. Normal bowel sounds. No organomegaly. Abdominal fullness.  Musculoskeletal: No pedal edema   Neurological: Intact  Data Reviewed: Basic Metabolic Panel:  Recent Labs Lab 05/17/15 1105 05/17/15 2048 05/18/15 0459 05/18/15 1335 05/19/15 0530  NA 125*  --  129*  --  133*  K 2.2*  --  2.4* 3.3* 3.4*  CL 85*  --  87*  --  92*  CO2 25  --  29  --  29  GLUCOSE 140*  --  89  --  81  BUN <5*  --  <5*  --  <5*  CREATININE 0.36*  --  0.36*  --  0.56*  CALCIUM 6.7*  --  7.0*  --  7.4*  MG  --  1.4*  --   --  1.9   Liver Function Tests:  Recent Labs Lab 05/17/15 1105 05/18/15 0459 05/19/15 0530  AST 266* 240* 226*  ALT 55 51 47  ALKPHOS 237* 212* 227*  BILITOT 5.9* 6.9* 7.2*  PROT 6.4* 5.8* 6.2*  ALBUMIN 2.5* 2.2* 2.5*   No results for input(s): LIPASE, AMYLASE in the last 168 hours.  Recent Labs Lab 05/17/15 2048  AMMONIA 101*   CBC:  Recent Labs Lab 05/17/15 1105 05/18/15 0459 05/19/15 0530  WBC 15.1* 12.8* 16.0*  NEUTROABS 12.0* 9.4*  --   HGB 14.1 13.3 14.3  HCT 37.0* 37.8* 40.5  MCV 94.9 100.3* 103.6*  PLT 169 158 178   Cardiac Enzymes:  Recent Labs Lab 05/17/15 1105  TROPONINI 0.03   BNP (last 3 results)  Recent Labs  05/17/15 1105  BNP 166.1*    ProBNP (last 3 results) No results for input(s): PROBNP in the last 8760 hours.  CBG:  Recent Labs Lab 05/18/15 0807 05/19/15 0806  GLUCAP 90 69    Recent Results (from the past 240 hour(s))  Urine culture     Status: None   Collection Time: 05/17/15 11:22 PM  Result Value Ref Range Status   Specimen Description URINE, CLEAN CATCH  Final   Special Requests NONE  Final   Culture   Final    NO GROWTH 1 DAY Performed at [facility]    Report Status  05/19/2015 FINAL  Final     Studies: No results found.  Scheduled Meds: . enoxaparin (LOVENOX) injection  40 mg Subcutaneous Q24H  . famotidine  20 mg Oral BID  . folic acid  1 mg Oral Daily  . furosemide  40 mg Oral Daily  . LORazepam  0-4 mg Oral Q12H  . multivitamin with minerals  1 tablet Oral Daily  . sodium chloride flush  3 mL Intravenous Q12H  . spironolactone  100 mg Oral Daily  . thiamine  100 mg Oral Daily   Or  . thiamine  100 mg Intravenous Daily   Continuous Infusions:    Time spent: 25 minutes    Deema Juncaj  [facility] Pager [phone]. If 7PM-7AM, please contact night-coverage at [email or web address], password [facility] 05/19/2015, 8:19 PM  LOS: 2 days

## 2015-05-19 NOTE — Progress Notes (Signed)
[facility] Progress Note    Since last GI note: Eating well, ambulating in hall.    Objective: Vital signs in last 24 hours: Temp:  [98 F (36.7 C)-98.4 F (36.9 C)] 98.4 F (36.9 C) (03/15 0521) Pulse Rate:  [100-120] 115 (03/15 0521) Resp:  [20] 20 (03/15 0521) BP: (130-131)/(84-90) 130/84 mmHg (03/15 0521) SpO2:  [98 %-100 %] 99 % (03/15 0521) Weight:  [245 lb 9.5 oz (111.4 kg)] 245 lb 9.5 oz (111.4 kg) (03/15 0521) Last BM Date: 05/19/15 General: alert and oriented times 3 Heart: regular rate and rythm Abdomen: soft, non-tender, non-distended, normal bowel sounds No asterixis but very slight tremor in hands  Lab Results:  Recent Labs  05/17/15 1105 05/18/15 0459 05/19/15 0530  WBC 15.1* 12.8* 16.0*  HGB 14.1 13.3 14.3  PLT 169 158 178  MCV 94.9 100.3* 103.6*    Recent Labs  05/17/15 1105 05/18/15 0459 05/18/15 1335 05/19/15 0530  NA 125* 129*  --  133*  K 2.2* 2.4* 3.3* 3.4*  CL 85* 87*  --  92*  CO2 25 29  --  29  GLUCOSE 140* 89  --  81  BUN <5* <5*  --  <5*  CREATININE 0.36* 0.36*  --  0.56*  CALCIUM 6.7* 7.0*  --  7.4*    Recent Labs  05/17/15 1105 05/18/15 0459 05/19/15 0530  PROT 6.4* 5.8* 6.2*  ALBUMIN 2.5* 2.2* 2.5*  AST 266* 240* 226*  ALT 55 51 47  ALKPHOS 237* 212* 227*  BILITOT 5.9* 6.9* 7.2*    Recent Labs  05/18/15 0459 05/19/15 0530  INR 1.44 1.41     Studies/Results: Dg Chest 2 View  05/17/2015  CLINICAL DATA:  Abdominal bloating and shortness of breath for 5 days, some diarrhea EXAM: CHEST  2 VIEW COMPARISON:  None FINDINGS: Upper normal heart size. Mediastinal contours and pulmonary vascularity normal. Subsegmental atelectasis RIGHT base with mild eventration of the anterior RIGHT diaphragm. Minimal subsegmental atelectasis LEFT base. Upper lungs clear. No pleural effusion or pneumothorax. IMPRESSION: Bibasilar atelectasis greater on RIGHT. Electronically Signed   By: Ulyses Southward M.D.   On: 05/17/2015  11:58   Ct Abdomen Pelvis W Contrast  05/17/2015  ADDENDUM REPORT: 05/17/2015 17:45 ADDENDUM: Adenopathy largest portacaval region with small lymph nodes gastrohepatic, peripancreatic and mesenteric region may be reactive in origin and can be assessed on followup liver MR after acute episode has been addressed. Electronically Signed   By: Lacy Duverney M.D.   On: 05/17/2015 17:45  05/17/2015  CLINICAL DATA:  30 year old male with 4-5 day history of abdominal swelling and foot swelling. Initial encounter. EXAM: CT ABDOMEN AND PELVIS WITH CONTRAST TECHNIQUE: Multidetector CT imaging of the abdomen and pelvis was performed using the standard protocol following bolus administration of intravenous contrast. CONTRAST:  25mL OMNIPAQUE IOHEXOL 300 MG/ML SOLN, 85mL OMNIPAQUE IOHEXOL 350 MG/ML SOLN COMPARISON:  None. FINDINGS: Enlarged markedly heterogeneous inflamed fatty liver. Findings consistent with steatohepatitis of indeterminate etiology. This is causing and narrowing of the hepatic course of the inferior vena cava (primary Budd-Chiari syndrome felt to be unlikely as cause for this finding) contributing to the patient's leg swelling. Clinical and laboratory correlation recommended to determine if hepatitis is related to alcohol/ medication ingestion versus viral/inflammatory in origin. After acute episode has been addressed, patient would benefit from follow-up liver MR to exclude the possibly of underlying mass. Early cirrhosis not excluded given the configuration of the liver (caudate/ lateral left lobe enlargement). Ascites. This limits  evaluation of bowel however, no primary bowel inflammatory process or free intraperitoneal air is noted. No cardiac enlargement to suggest cardiac cause of liver abnormality. No splenic enlargement. Main portal vein and splenic vein appear patent. No worrisome splenic, pancreatic, renal or adrenal abnormality. Evaluation of the gallbladder is limited by the surrounding ascites  and liver inflammation. No acute osseous abnormality.  L5-S1 degenerative changes. Mild third spacing of fluid subcutaneous region. Lung bases clear. IMPRESSION: Enlarged markedly heterogeneous inflamed fatty liver. Findings consistent with steatohepatitis of indeterminate etiology. This is causing and narrowing of the hepatic course of the inferior vena cava contributing to the patient's leg swelling. Clinical and laboratory correlation recommended to determine if hepatitis is related to alcohol/ medication ingestion versus viral/inflammatory in origin. After acute episode has been addressed, patient would benefit from follow-up liver MR to exclude the possibly of underlying mass. Early cirrhosis not excluded given the configuration of the liver (caudate/ lateral left lobe enlargement). Ascites. Evaluation of the gallbladder is limited by the surrounding ascites and liver inflammation. Mild third spacing of fluid subcutaneous region. Electronically Signed: By: Lacy DuverneySteven  Olson M.D. On: 05/17/2015 14:00     Medications: Scheduled Meds: . enoxaparin (LOVENOX) injection  40 mg Subcutaneous Q24H  . famotidine  20 mg Oral BID  . folic acid  1 mg Oral Daily  . furosemide  40 mg Oral Daily  . LORazepam  0-4 mg Oral Q6H   Followed by  . LORazepam  0-4 mg Oral Q12H  . multivitamin with minerals  1 tablet Oral Daily  . sodium chloride flush  3 mL Intravenous Q12H  . spironolactone  100 mg Oral Daily  . thiamine  100 mg Oral Daily   Or  . thiamine  100 mg Intravenous Daily   Continuous Infusions:  PRN Meds:.bisacodyl, HYDROmorphone (DILAUDID) injection, LORazepam **OR** LORazepam, ondansetron **OR** ondansetron (ZOFRAN) IV, oxyCODONE, polyethylene glycol    Assessment/Plan: 30 y.o. male with acute alcoholic hepatitis  Hepatitis discriminant function is 32 today. Steroids indicated if this gets above 32, will recheck tomorrow.  I favor him staying in patient until we know his liver is improving, LFTs  trending down.  This may be a few more days. In the meantime he needs to continue eating, ambulating and being observed for DTs.  He has mild tremor today which may be the beginning of DTs, last drink was 3-4 days ago now.   Rachael FeeJacobs, Daniel P, MD  05/19/2015, 9:18 AM [facility] Pager [phone]

## 2015-05-20 LAB — COMPREHENSIVE METABOLIC PANEL
ALT: 42 U/L (ref 17–63)
AST: 189 U/L — ABNORMAL HIGH (ref 15–41)
Albumin: 2.2 g/dL — ABNORMAL LOW (ref 3.5–5.0)
Alkaline Phosphatase: 203 U/L — ABNORMAL HIGH (ref 38–126)
Anion gap: 10 (ref 5–15)
BUN: 6 mg/dL (ref 6–20)
CALCIUM: 7 mg/dL — AB (ref 8.9–10.3)
CHLORIDE: 89 mmol/L — AB (ref 101–111)
CO2: 29 mmol/L (ref 22–32)
CREATININE: 0.47 mg/dL — AB (ref 0.61–1.24)
Glucose, Bld: 86 mg/dL (ref 65–99)
Potassium: 3.4 mmol/L — ABNORMAL LOW (ref 3.5–5.1)
Sodium: 128 mmol/L — ABNORMAL LOW (ref 135–145)
TOTAL PROTEIN: 5.8 g/dL — AB (ref 6.5–8.1)
Total Bilirubin: 6.7 mg/dL — ABNORMAL HIGH (ref 0.3–1.2)

## 2015-05-20 LAB — CBC WITH DIFFERENTIAL/PLATELET
Basophils Absolute: 0 10*3/uL (ref 0.0–0.1)
Basophils Relative: 0 %
EOS PCT: 0 %
Eosinophils Absolute: 0 10*3/uL (ref 0.0–0.7)
HCT: 38.9 % — ABNORMAL LOW (ref 39.0–52.0)
Hemoglobin: 13.3 g/dL (ref 13.0–17.0)
LYMPHS ABS: 1.8 10*3/uL (ref 0.7–4.0)
LYMPHS PCT: 14 %
MCH: 35.7 pg — AB (ref 26.0–34.0)
MCHC: 34.2 g/dL (ref 30.0–36.0)
MCV: 104.3 fL — AB (ref 78.0–100.0)
MONO ABS: 2.1 10*3/uL — AB (ref 0.1–1.0)
Monocytes Relative: 16 %
Neutro Abs: 9.2 10*3/uL — ABNORMAL HIGH (ref 1.7–7.7)
Neutrophils Relative %: 70 %
PLATELETS: 163 10*3/uL (ref 150–400)
RBC: 3.73 MIL/uL — AB (ref 4.22–5.81)
RDW: 16.2 % — ABNORMAL HIGH (ref 11.5–15.5)
WBC: 13 10*3/uL — AB (ref 4.0–10.5)

## 2015-05-20 LAB — GLUCOSE, CAPILLARY: GLUCOSE-CAPILLARY: 114 mg/dL — AB (ref 65–99)

## 2015-05-20 LAB — PROTIME-INR
INR: 1.47 (ref 0.00–1.49)
Prothrombin Time: 17.9 seconds — ABNORMAL HIGH (ref 11.6–15.2)

## 2015-05-20 LAB — MAGNESIUM: MAGNESIUM: 2.2 mg/dL (ref 1.7–2.4)

## 2015-05-20 MED ORDER — POTASSIUM CHLORIDE 20 MEQ/15ML (10%) PO SOLN
40.0000 meq | Freq: Once | ORAL | Status: AC
  Administered 2015-05-20: 40 meq via ORAL
  Filled 2015-05-20: qty 30

## 2015-05-20 NOTE — Progress Notes (Signed)
     [facility] Progress Note  Subjective:  Feeling better.  No sign of withdrawal.  Has been eating and up walking around.    Objective:  Vital signs in last 24 hours: Temp:  [98 F (36.7 C)-98.1 F (36.7 C)] 98 F (36.7 C) (03/16 0526) Pulse Rate:  [109-122] 109 (03/16 0526) Resp:  [18-20] 18 (03/16 0526) BP: (123-156)/(71-99) 123/81 mmHg (03/16 0526) SpO2:  [97 %-100 %] 97 % (03/16 0526) Weight:  [245 lb 13 oz (111.5 kg)] 245 lb 13 oz (111.5 kg) (03/16 0526) Last BM Date: 05/20/15 General:  Alert, Well-developed, in NAD Heart:  Slightly tachy.  No M/R/G. Pulm:  CTAB.  No W/R/R. Abdomen:  Soft, less distended.  BS present.  Non-tender. Extremities:  Still with significant edema in B/L ankles/LE's. Neurologic:  Alert and oriented x 4;  grossly normal neurologically. Psych:  Alert and cooperative. Normal mood and affect.  Intake/Output from previous day: 03/15 0701 - 03/16 0700 In: 170 [P.O.:120; IV Piggyback:50] Out: 900 [Urine:900]  Lab Results:  Recent Labs  05/18/15 0459 05/19/15 0530 05/20/15 0522  WBC 12.8* 16.0* 13.0*  HGB 13.3 14.3 13.3  HCT 37.8* 40.5 38.9*  PLT 158 178 163   BMET  Recent Labs  05/18/15 0459 05/18/15 1335 05/19/15 0530 05/20/15 0522  NA 129*  --  133* 128*  K 2.4* 3.3* 3.4* 3.4*  CL 87*  --  92* 89*  CO2 29  --  29 29  GLUCOSE 89  --  81 86  BUN <5*  --  <5* 6  CREATININE 0.36*  --  0.56* 0.47*  CALCIUM 7.0*  --  7.4* 7.0*   LFT  Recent Labs  05/20/15 0522  PROT 5.8*  ALBUMIN 2.2*  AST 189*  ALT 42  ALKPHOS 203*  BILITOT 6.7*   PT/INR  Recent Labs  05/19/15 0530 05/20/15 0522  LABPROT 17.4* 17.9*  INR 1.41 1.47   Hepatitis Panel  Recent Labs  05/17/15 2048  HEPBSAG Negative  HCVAB <0.1  HEPAIGM Negative  HEPBIGM Negative   Assessment / Plan: 30 y.o. male with acute alcoholic hepatitis  Hepatitis discriminant function still below 32. LFT's improving slowly.  Potassium improved, but  sodium still slightly low.  Continue diuretics at Lasix 40 mg daily and spironolactone 100 mg daily.  If continues to show even slight improvement then would expect that he could likely be discharged tomorrow.  Obviously needs ETOH abstinence.   LOS: 3 days   ZEHR, JESSICA D.  05/20/2015, 8:59 AM  Pager number [phone]   ________________________________________________________________________  [facility] note:  I  reviewed the data and agree with the assessment and plan described above.  If he is OK for d/c tomorrow, he'll need CMET, inr in 7-10 days in my office and return office visit in 6-7 weeks. Obviously, most important is that he not resume drinking.   Rob Buntinganiel Marijean Montanye, MD [facility] Pager [phone]

## 2015-05-20 NOTE — Progress Notes (Signed)
[facility] PROGRESS NOTE  Radene GunningJustin Barno ZOX:[id number] DOB: [birth year] DOA: 05/17/2015 PCP: No primary care provider on file.  Summary 05/19/15: I have seen and examined Mr. Thomas Ross at bedside and reviewed his chart. Appreciate GI. Radene GunningJustin Corti is a pleasant 30 year-old male with a history of alcohol abuse who presented with bilateral lower lower extremity edema and abdominal swelling for approximately 1-2 weeks and he was found to have acute alcoholic hepatitis with anasarca. Patient does endorse a heavy alcohol use for the last 7-8 years and drinks approximate 30 beers per day. Gastroenterology has recommended monitoring patient in house to ensure liver function improves. His white count keeps increasing and this is concerning. He has bibasilar atelectasis with no evidence of infection. We'll therefore continue to monitor. He denies any complaints. There is no evidence of alcohol withdrawal. Will follow GI recommendations. 05/20/15: Appreciate GI. LFTs improving, wbc declining. Patient reports less abdominal discomfort. Electrolytes off likely due to diuresis. Will continue to replenish electrolytes and follow GI recommendations. Plan Alcoholic hepatitis/Alcoholism (HCC)/Anasarca/Ascites/Bilateral lower extremity edema  Continue management per GI  CIWA protocol Atelectasis/Leucocytosis  Incentive spirometry  Consider antibiotics if white count increases further. Hypokalemia/hypomagnesemia  Replenish electrolytes as needed and  Code Status: Full code Family Communication: None at bedside Disposition Plan: Home when clinically ready ?tomorrow   Consultants:  GI  Procedures:    Antibiotics:  None  HPI/Subjective: Feels better.  Objective: Filed Vitals:   05/20/15 1100 05/20/15 1518  BP: 129/82 103/62  Pulse: 107 74  Temp: 98.2 F (36.8 C) 98.2 F (36.8 C)  Resp:      Intake/Output Summary (Last 24 hours) at 05/20/15 2047 Last data filed at 05/20/15 0359  Gross per 24 hour  Intake    170 ml  Output    900 ml  Net   -730 ml   Filed Weights   05/18/15 0544 05/19/15 0521 05/20/15 0526  Weight: 111.5 kg (245 lb 13 oz) 111.4 kg (245 lb 9.5 oz) 111.5 kg (245 lb 13 oz)    Exam:   General:  Comfortable at rest.  Cardiovascular: S1-S2 normal. No murmurs. Pulse regular.  Respiratory: Good air entry bilaterally. No rhonchi or rales.  Abdomen: Soft and nontender. Normal bowel sounds. No organomegaly.  Musculoskeletal: Less pedal edema   Neurological: Intact  Data Reviewed: Basic Metabolic Panel:  Recent Labs Lab 05/17/15 1105 05/17/15 2048 05/18/15 0459 05/18/15 1335 05/19/15 0530 05/20/15 0522  NA 125*  --  129*  --  133* 128*  K 2.2*  --  2.4* 3.3* 3.4* 3.4*  CL 85*  --  87*  --  92* 89*  CO2 25  --  29  --  29 29  GLUCOSE 140*  --  89  --  81 86  BUN <5*  --  <5*  --  <5* 6  CREATININE 0.36*  --  0.36*  --  0.56* 0.47*  CALCIUM 6.7*  --  7.0*  --  7.4* 7.0*  MG  --  1.4*  --   --  1.9 2.2   Liver Function Tests:  Recent Labs Lab 05/17/15 1105 05/18/15 0459 05/19/15 0530 05/20/15 0522  AST 266* 240* 226* 189*  ALT 55 51 47 42  ALKPHOS 237* 212* 227* 203*  BILITOT 5.9* 6.9* 7.2* 6.7*  PROT 6.4* 5.8* 6.2* 5.8*  ALBUMIN 2.5* 2.2* 2.5* 2.2*   No results for input(s): LIPASE, AMYLASE in the last 168 hours.  Recent Labs Lab 05/17/15 2048  AMMONIA 101*  CBC:  Recent Labs Lab 05/17/15 1105 05/18/15 0459 05/19/15 0530 05/20/15 0522  WBC 15.1* 12.8* 16.0* 13.0*  NEUTROABS 12.0* 9.4*  --  9.2*  HGB 14.1 13.3 14.3 13.3  HCT 37.0* 37.8* 40.5 38.9*  MCV 94.9 100.3* 103.6* 104.3*  PLT 169 158 178 163   Cardiac Enzymes:  Recent Labs Lab 05/17/15 1105  TROPONINI 0.03   BNP (last 3 results)  Recent Labs  05/17/15 1105  BNP 166.1*    ProBNP (last 3 results) No results for input(s): PROBNP in the last 8760 hours.  CBG:  Recent Labs Lab 05/18/15 0807 05/19/15 0806 05/20/15 0745  GLUCAP 90  69 114*    Recent Results (from the past 240 hour(s))  Urine culture     Status: None   Collection Time: 05/17/15 11:22 PM  Result Value Ref Range Status   Specimen Description URINE, CLEAN CATCH  Final   Special Requests NONE  Final   Culture   Final    NO GROWTH 1 DAY Performed at [facility]    Report Status 05/19/2015 FINAL  Final     Studies: No results found.  Scheduled Meds: . enoxaparin (LOVENOX) injection  40 mg Subcutaneous Q24H  . famotidine  20 mg Oral BID  . folic acid  1 mg Oral Daily  . furosemide  40 mg Oral Daily  . LORazepam  0-4 mg Oral Q12H  . multivitamin with minerals  1 tablet Oral Daily  . sodium chloride flush  3 mL Intravenous Q12H  . spironolactone  100 mg Oral Daily  . thiamine  100 mg Oral Daily   Or  . thiamine  100 mg Intravenous Daily   Continuous Infusions:    Time spent: 15 minutes    Frayda Egley  [facility] Pager [phone]. If 7PM-7AM, please contact night-coverage at [email or web address], password [facility] 05/20/2015, 8:47 PM  LOS: 3 days

## 2015-05-21 ENCOUNTER — Telehealth: Payer: Self-pay

## 2015-05-21 DIAGNOSIS — K7010 Alcoholic hepatitis without ascites: Secondary | ICD-10-CM

## 2015-05-21 LAB — COMPREHENSIVE METABOLIC PANEL
ALBUMIN: 2.2 g/dL — AB (ref 3.5–5.0)
ALK PHOS: 186 U/L — AB (ref 38–126)
ALT: 40 U/L (ref 17–63)
ANION GAP: 10 (ref 5–15)
AST: 186 U/L — ABNORMAL HIGH (ref 15–41)
BUN: 5 mg/dL — AB (ref 6–20)
CALCIUM: 7.3 mg/dL — AB (ref 8.9–10.3)
CHLORIDE: 95 mmol/L — AB (ref 101–111)
CO2: 28 mmol/L (ref 22–32)
CREATININE: 0.49 mg/dL — AB (ref 0.61–1.24)
GFR calc non Af Amer: >60 mL/min (ref >60)
GLUCOSE: 77 mg/dL (ref 65–99)
Potassium: 3.6 mmol/L (ref 3.5–5.1)
SODIUM: 133 mmol/L — AB (ref 135–145)
Total Bilirubin: 6.7 mg/dL — ABNORMAL HIGH (ref 0.3–1.2)
Total Protein: 5.6 g/dL — ABNORMAL LOW (ref 6.5–8.1)

## 2015-05-21 LAB — CBC WITH DIFFERENTIAL/PLATELET
BASOS PCT: 0 %
Basophils Absolute: 0 10*3/uL (ref 0.0–0.1)
EOS ABS: 0 10*3/uL (ref 0.0–0.7)
EOS PCT: 0 %
HCT: 38.5 % — ABNORMAL LOW (ref 39.0–52.0)
HEMOGLOBIN: 13.6 g/dL (ref 13.0–17.0)
Lymphocytes Relative: 17 %
Lymphs Abs: 2.4 10*3/uL (ref 0.7–4.0)
MCH: 35.9 pg — ABNORMAL HIGH (ref 26.0–34.0)
MCHC: 35.3 g/dL (ref 30.0–36.0)
MCV: 101.6 fL — ABNORMAL HIGH (ref 78.0–100.0)
Monocytes Absolute: 2.4 10*3/uL — ABNORMAL HIGH (ref 0.1–1.0)
Monocytes Relative: 17 %
NEUTROS PCT: 66 %
Neutro Abs: 9.1 10*3/uL — ABNORMAL HIGH (ref 1.7–7.7)
PLATELETS: 172 10*3/uL (ref 150–400)
RBC: 3.79 MIL/uL — AB (ref 4.22–5.81)
RDW: 16.3 % — ABNORMAL HIGH (ref 11.5–15.5)
WBC: 13.9 10*3/uL — AB (ref 4.0–10.5)

## 2015-05-21 LAB — GLUCOSE, CAPILLARY: Glucose-Capillary: 69 mg/dL (ref 65–99)

## 2015-05-21 LAB — PROTIME-INR
INR: 1.44 (ref 0.00–1.49)
Prothrombin Time: 17.6 seconds — ABNORMAL HIGH (ref 11.6–15.2)

## 2015-05-21 MED ORDER — FUROSEMIDE 40 MG PO TABS: 40.0000 mg | ORAL_TABLET | Freq: Every day | ORAL | Status: AC

## 2015-05-21 MED ORDER — THIAMINE HCL 100 MG PO TABS: 100.0000 mg | ORAL_TABLET | Freq: Every day | ORAL | Status: AC

## 2015-05-21 MED ORDER — FOLIC ACID 1 MG PO TABS: 1.0000 mg | ORAL_TABLET | Freq: Every day | ORAL | Status: AC

## 2015-05-21 MED ORDER — FAMOTIDINE 20 MG PO TABS: 20.0000 mg | ORAL_TABLET | Freq: Two times a day (BID) | ORAL | Status: AC

## 2015-05-21 MED ORDER — POTASSIUM CHLORIDE ER 10 MEQ PO TBCR: 10.0000 meq | EXTENDED_RELEASE_TABLET | Freq: Every day | ORAL | Status: AC

## 2015-05-21 MED ORDER — TRAMADOL HCL 50 MG PO TABS: 100.0000 mg | ORAL_TABLET | Freq: Four times a day (QID) | ORAL | Status: AC | PRN

## 2015-05-21 MED ORDER — SPIRONOLACTONE 100 MG PO TABS: 100.0000 mg | ORAL_TABLET | Freq: Every day | ORAL | Status: AC

## 2015-05-21 NOTE — Telephone Encounter (Signed)
-----   Message from Rachael Feeaniel P Jacobs, MD sent at 05/21/2015  8:13 AM EDT ----- He needs cmet, inr next Thursday or Friday and rov with me or extender in 6-7 weeks.  He's leaving the hospital later today, thanks

## 2015-05-21 NOTE — Progress Notes (Signed)
[facility] Progress Note    Since last GI note: Ambulating, eating well.  Thinking clearly.  Objective: Vital signs in last 24 hours: Temp:  [98.2 F (36.8 C)-99.3 F (37.4 C)] 98.6 F (37 C) (03/17 0612) Pulse Rate:  [74-107] 102 (03/17 0612) Resp:  [18] 18 (03/17 0612) BP: (103-138)/(62-82) 128/77 mmHg (03/17 0612) SpO2:  [98 %-100 %] 99 % (03/17 0612) Weight:  [245 lb 9.5 oz (111.4 kg)] 245 lb 9.5 oz (111.4 kg) (03/17 0612) Last BM Date: 05/20/15 General: alert and oriented times 3 Heart: regular rate and rythm Abdomen: soft, non-tender, non-distended, normal bowel sounds 2+ pitting edema bilaterally No asterixis,  Lab Results:  Recent Labs  05/19/15 0530 05/20/15 0522 05/21/15 0450  WBC 16.0* 13.0* 13.9*  HGB 14.3 13.3 13.6  PLT 178 163 172  MCV 103.6* 104.3* 101.6*    Recent Labs  05/19/15 0530 05/20/15 0522 05/21/15 0450  NA 133* 128* 133*  K 3.4* 3.4* 3.6  CL 92* 89* 95*  CO2 29 29 28   GLUCOSE 81 86 77  BUN <5* 6 5*  CREATININE 0.56* 0.47* 0.49*  CALCIUM 7.4* 7.0* 7.3*    Recent Labs  05/19/15 0530 05/20/15 0522 05/21/15 0450  PROT 6.2* 5.8* 5.6*  ALBUMIN 2.5* 2.2* 2.2*  AST 226* 189* 186*  ALT 47 42 40  ALKPHOS 227* 203* 186*  BILITOT 7.2* 6.7* 6.7*    Recent Labs  05/20/15 0522 05/21/15 0450  INR 1.47 1.44    Medications: Scheduled Meds: . enoxaparin (LOVENOX) injection  40 mg Subcutaneous Q24H  . famotidine  20 mg Oral BID  . folic acid  1 mg Oral Daily  . furosemide  40 mg Oral Daily  . LORazepam  0-4 mg Oral Q12H  . multivitamin with minerals  1 tablet Oral Daily  . sodium chloride flush  3 mL Intravenous Q12H  . spironolactone  100 mg Oral Daily  . thiamine  100 mg Oral Daily   Or  . thiamine  100 mg Intravenous Daily   Continuous Infusions:  PRN Meds:.bisacodyl, HYDROmorphone (DILAUDID) injection, ondansetron **OR** ondansetron (ZOFRAN) IV, oxyCODONE, polyethylene glycol    Assessment/Plan: 30  y.o. male with alcoholic hepatitis  Ok for d/c today from my perspective.  He should stay on the current dose of diuretics (aldactone 100mg  daily, lasix 40mg  daily).  He will get labs next week in my office (INR, CMET) and we will arrange for office visit in 6-7 weeks.  (I have set these visits up already). Obviously most important is that he never resume drinking and I think he understands that.   Rachael FeeJacobs, Adylee Leonardo P, MD  05/21/2015, 8:03 AM [facility] Pager [phone]

## 2015-05-21 NOTE — Progress Notes (Signed)
IV and telemetry box removed, patient given discharge, follow up and medication instructions, verbalized understanding, family to transport home.

## 2015-05-21 NOTE — Discharge Summary (Signed)
Thomas Ross, is a 30 y.o. male  DOB 1985/09/26  MRN [id number].  Admission date:  05/17/2015  Admitting Physician  Briscoe Deutscherimothy S Opyd, MD  Discharge Date:  05/21/2015   Primary MD  No primary care provider on file.  Recommendations for primary care physician for things to follow:  Follow renal/liver function, adjust diuretics as needed and refer to GI. Have low threshold to start antibiotics if fever.  Admission Diagnosis   Hypokalemia [E87.6] Acute hepatitis [K72.00] Bilateral lower extremity edema [R60.0] Ascites [R18.8]   Discharge Diagnosis  Hypokalemia [E87.6] Acute hepatitis [K72.00] Bilateral lower extremity edema [R60.0] Ascites [R18.8]   Principal Problem:   Alcoholic hepatitis Active Problems:   Alcoholism (HCC)   Anasarca   Prolonged Q-T interval on ECG   Ascites   Bilateral lower extremity edema   Hypokalemia   Leukocytosis   Atelectasis   Hypomagnesemia      Summary Thomas Ross is a pleasant 30 year old male with alcohol abuse admitted with acute alcoholic hepatitis associated with anasarca. He was seen by GI who placed him on diuretics and he slowly improved. He also had some atelectasis and leukocytosis with no clear evidence of infection and this will need to be followed outpatient. He will d/c home to follow with his PCP and GI in the next 1-2 weeks. Alcohol cessation was encouraged. Please refer to the daily progress notes below for details of patient's hospital stay;  Hospital Course  05/19/15: I have seen and examined Thomas Ross at bedside and reviewed his chart. Appreciate GI. Thomas Ross is a pleasant[age] year-old male with a history of alcohol abuse whopresented with bilateral lower lower extremity edema and abdominal swelling for approximately 1-2 weeks and he was found to have acute  alcoholic hepatitis with anasarca. Patient does endorse a heavy alcohol use for the last 7-8 years and drinks approximate 30 beers per day. Gastroenterology has recommended monitoring patient in house to ensure liver function improves. His white count keeps increasing and this is concerning. He has bibasilar atelectasis with no evidence of infection. We'll therefore continue to monitor. He denies any complaints. There is no evidence of alcohol withdrawal. Will follow GI recommendations. 05/20/15: Appreciate GI. LFTs improving, wbc declining. Patient reports less abdominal discomfort. Electrolytes off likely due to diuresis. Will continue to replenish electrolytes and follow GI recommendations. 05/21/15: Feels better. Liver function better. Will DC home today.  Discharge Condition Stable.  Consults obtained  GI  Follow UP PCP GI   Discharge Instructions  and  Discharge Medications  Discharge Instructions    Diet - low sodium heart healthy    Complete by:  As directed      Discharge instructions    Complete by:  As directed   Follow PCP/GI in 1-2 weeks     Increase activity slowly    Complete by:  As directed             Medication List    STOP taking these medications        THERAFLU  FLU/COLD PO      TAKE these medications        famotidine 20 MG tablet  Commonly known as:  PEPCID  Take 1 tablet (20 mg total) by mouth 2 (two) times daily.     folic acid 1 MG tablet  Commonly known as:  FOLVITE  Take 1 tablet (1 mg total) by mouth daily.     furosemide 40 MG tablet  Commonly known as:  LASIX  Take 1 tablet (40 mg total) by mouth daily.     LAXATIVE PO  Take 1 tablet by mouth daily as needed (constipation).     potassium chloride 10 MEQ tablet  Commonly known as:  K-DUR  Take 1 tablet (10 mEq total) by mouth daily.     spironolactone 100 MG tablet  Commonly known as:  ALDACTONE  Take 1 tablet (100 mg total) by mouth daily.     thiamine 100 MG tablet  Take 1  tablet (100 mg total) by mouth daily.     traMADol 50 MG tablet  Commonly known as:  ULTRAM  Take 2 tablets (100 mg total) by mouth every 6 (six) hours as needed.        Diet and Activity recommendation: See Discharge Instructions above  Major procedures and Radiology Reports - PLEASE review detailed and final reports for all details, in brief -    Dg Chest 2 View  05/17/2015  CLINICAL DATA:  Abdominal bloating and shortness of breath for 5 days, some diarrhea EXAM: CHEST  2 VIEW COMPARISON:  None FINDINGS: Upper normal heart size. Mediastinal contours and pulmonary vascularity normal. Subsegmental atelectasis RIGHT base with mild eventration of the anterior RIGHT diaphragm. Minimal subsegmental atelectasis LEFT base. Upper lungs clear. No pleural effusion or pneumothorax. IMPRESSION: Bibasilar atelectasis greater on RIGHT. Electronically Signed   By: Ulyses Southward M.D.   On: 05/17/2015 11:58   Ct Abdomen Pelvis W Contrast  05/17/2015  ADDENDUM REPORT: 05/17/2015 17:45 ADDENDUM: Adenopathy largest portacaval region with small lymph nodes gastrohepatic, peripancreatic and mesenteric region may be reactive in origin and can be assessed on followup liver MR after acute episode has been addressed. Electronically Signed   By: Lacy Duverney M.D.   On: 05/17/2015 17:45  05/17/2015  CLINICAL DATA:  30 year old male with 4-5 day history of abdominal swelling and foot swelling. Initial encounter. EXAM: CT ABDOMEN AND PELVIS WITH CONTRAST TECHNIQUE: Multidetector CT imaging of the abdomen and pelvis was performed using the standard protocol following bolus administration of intravenous contrast. CONTRAST:  25mL OMNIPAQUE IOHEXOL 300 MG/ML SOLN, 85mL OMNIPAQUE IOHEXOL 350 MG/ML SOLN COMPARISON:  None. FINDINGS: Enlarged markedly heterogeneous inflamed fatty liver. Findings consistent with steatohepatitis of indeterminate etiology. This is causing and narrowing of the hepatic course of the inferior vena cava  (primary Budd-Chiari syndrome felt to be unlikely as cause for this finding) contributing to the patient's leg swelling. Clinical and laboratory correlation recommended to determine if hepatitis is related to alcohol/ medication ingestion versus viral/inflammatory in origin. After acute episode has been addressed, patient would benefit from follow-up liver MR to exclude the possibly of underlying mass. Early cirrhosis not excluded given the configuration of the liver (caudate/ lateral left lobe enlargement). Ascites. This limits evaluation of bowel however, no primary bowel inflammatory process or free intraperitoneal air is noted. No cardiac enlargement to suggest cardiac cause of liver abnormality. No splenic enlargement. Main portal vein and splenic vein appear patent. No worrisome splenic, pancreatic, renal or adrenal abnormality. Evaluation of  the gallbladder is limited by the surrounding ascites and liver inflammation. No acute osseous abnormality.  L5-S1 degenerative changes. Mild third spacing of fluid subcutaneous region. Lung bases clear. IMPRESSION: Enlarged markedly heterogeneous inflamed fatty liver. Findings consistent with steatohepatitis of indeterminate etiology. This is causing and narrowing of the hepatic course of the inferior vena cava contributing to the patient's leg swelling. Clinical and laboratory correlation recommended to determine if hepatitis is related to alcohol/ medication ingestion versus viral/inflammatory in origin. After acute episode has been addressed, patient would benefit from follow-up liver MR to exclude the possibly of underlying mass. Early cirrhosis not excluded given the configuration of the liver (caudate/ lateral left lobe enlargement). Ascites. Evaluation of the gallbladder is limited by the surrounding ascites and liver inflammation. Mild third spacing of fluid subcutaneous region. Electronically Signed: By: Lacy Duverney M.D. On: 05/17/2015 14:00    Micro  Results   Recent Results (from the past 240 hour(s))  Urine culture     Status: None   Collection Time: 05/17/15 11:22 PM  Result Value Ref Range Status   Specimen Description URINE, CLEAN CATCH  Final   Special Requests NONE  Final   Culture   Final    NO GROWTH 1 DAY Performed at [facility]    Report Status 05/19/2015 FINAL  Final       Today   Subjective:   Thomas Ross denies any complaints..   Objective:   Blood pressure 128/77, pulse 102, temperature 98.6 F (37 C), temperature source Oral, resp. rate 18, height  (1.854 m), weight 111.4 kg (245 lb 9.5 oz), SpO2 99 %.   Intake/Output Summary (Last 24 hours) at 05/21/15 1042 Last data filed at 05/21/15 0930  Gross per 24 hour  Intake    240 ml  Output   1000 ml  Net   -760 ml    Exam Abdomen slightly swollen.  Data Review   CBC w Diff: Lab Results  Component Value Date   WBC 13.9* 05/21/2015   HGB 13.6 05/21/2015   HCT 38.5* 05/21/2015   PLT 172 05/21/2015   LYMPHOPCT 17 05/21/2015   MONOPCT 17 05/21/2015   EOSPCT 0 05/21/2015   BASOPCT 0 05/21/2015    CMP: Lab Results  Component Value Date   NA 133* 05/21/2015   K 3.6 05/21/2015   CL 95* 05/21/2015   CO2 28 05/21/2015   BUN 5* 05/21/2015   CREATININE 0.49* 05/21/2015   PROT 5.6* 05/21/2015   ALBUMIN 2.2* 05/21/2015   BILITOT 6.7* 05/21/2015   ALKPHOS 186* 05/21/2015   AST 186* 05/21/2015   ALT 40 05/21/2015  .   Total Time in preparing paper work, data evaluation and todays exam -  20 minutes  Honesti Seaberg M.D on 05/21/2015 at 10:42 AM  [facility] Office  [phone]

## 2015-05-21 NOTE — Telephone Encounter (Signed)
Letter mailed to the pt with appt info as well as lab order.

## 2015-05-21 NOTE — Care Management Note (Signed)
Case Management Note  Patient Details  Name: Radene GunningJustin Drinkard MRN: [id number] Date of Birth: 03-09-1985  Subjective/Objective:                    Action/Plan:d/c home no needs or orders.   Expected Discharge Date:   (unknown)               Expected Discharge Plan:  Home/Self Care  In-House Referral:     Discharge planning Services  CM Consult  Post Acute Care Choice:    Choice offered to:     DME Arranged:    DME Agency:     HH Arranged:    HH Agency:     Status of Service:  Completed, signed off  Medicare Important Message Given:    Date Medicare IM Given:    Medicare IM give by:    Date Additional Medicare IM Given:    Additional Medicare Important Message give by:     If discussed at Long Length of Stay Meetings, dates discussed:    Additional Comments:  Lanier ClamMahabir, Dmiyah Liscano, RN 05/21/2015, 10:47 AM

## 2015-07-09 ENCOUNTER — Ambulatory Visit: Payer: Self-pay | Admitting: Gastroenterology

## 2017-04-19 IMAGING — CR DG CHEST 2V
2 series · 2 of 2 positions shown · non-contrast
Comparison: None

CLINICAL DATA: Abdominal bloating and shortness of breath for 5
days, some diarrhea

EXAM:
CHEST  2 VIEW

[w chest pa]
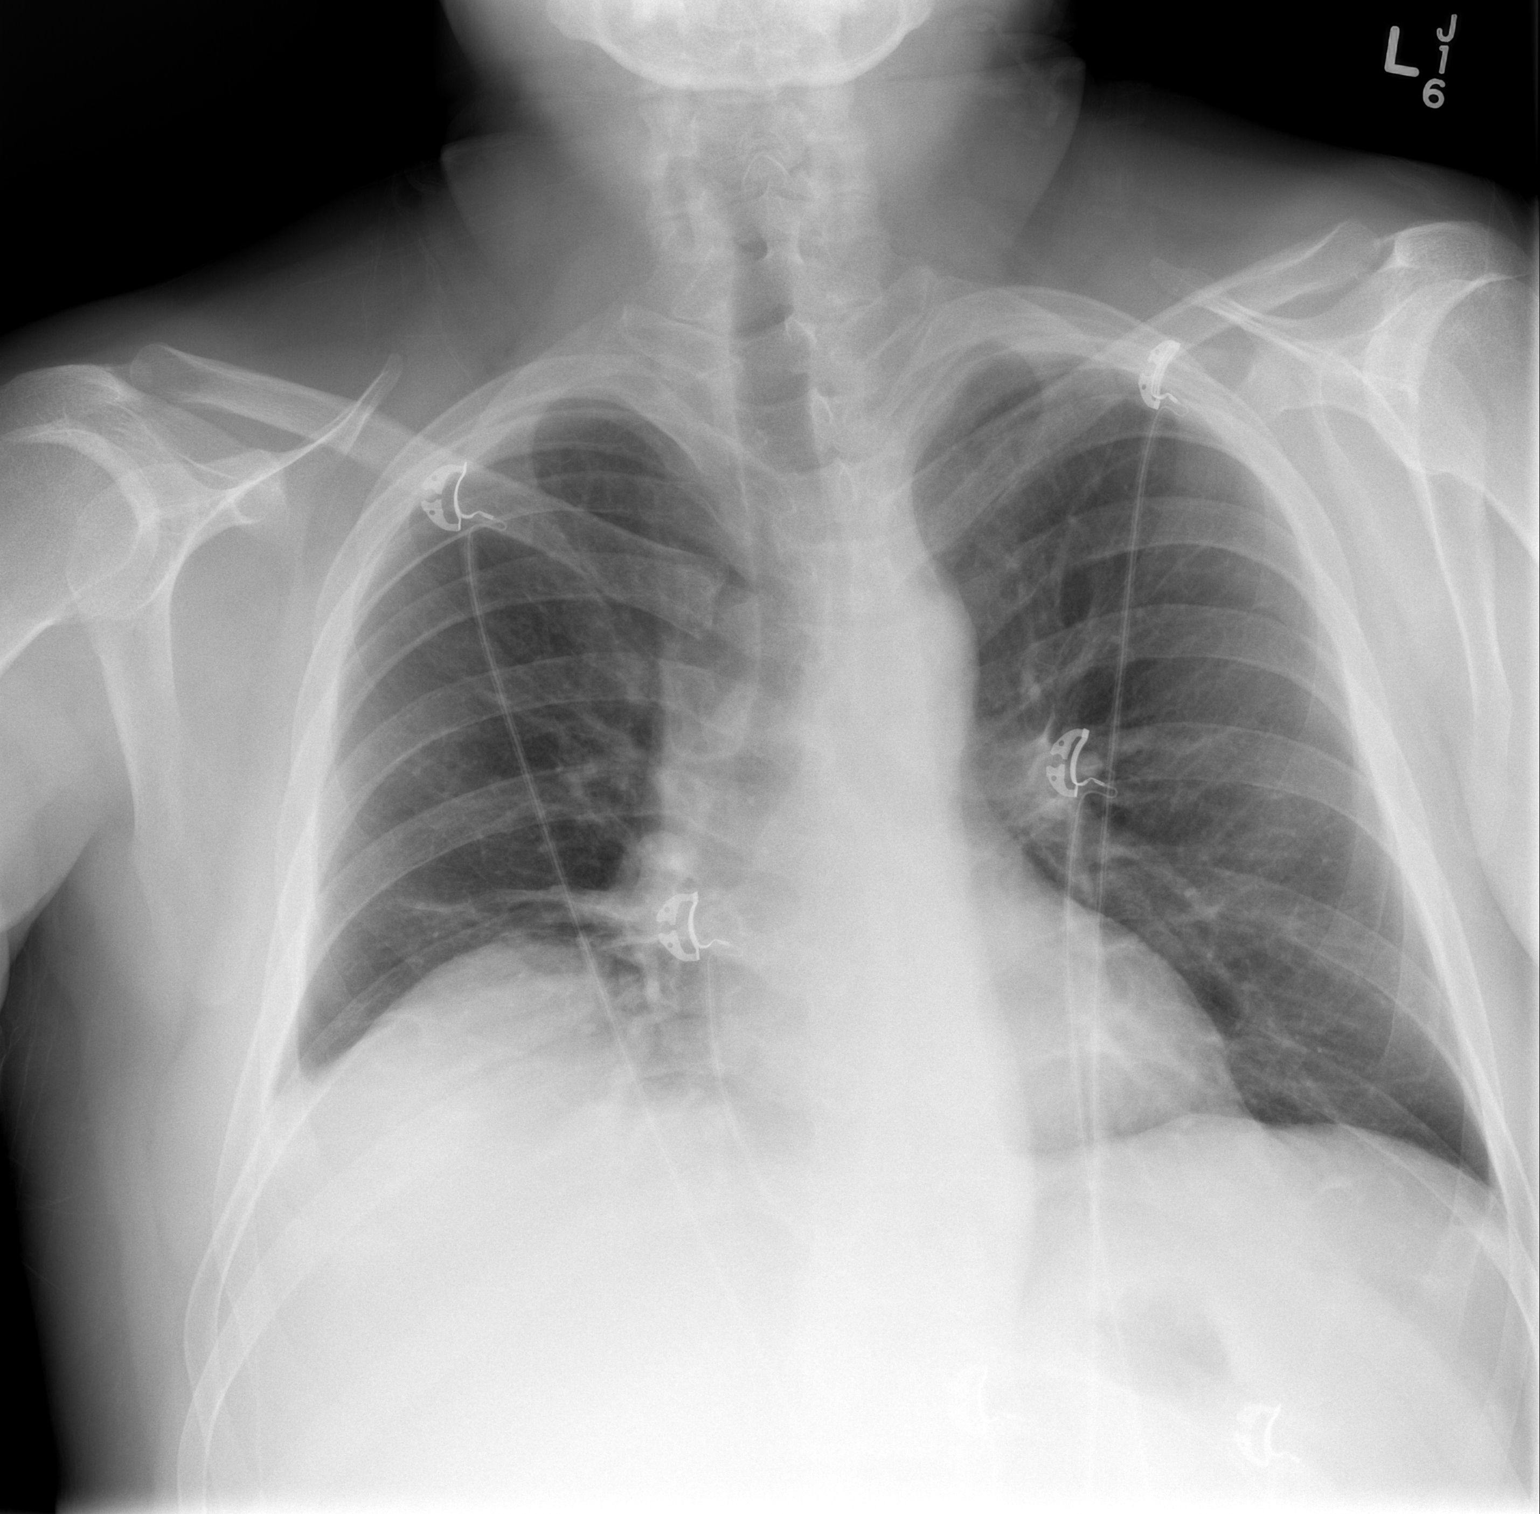

[w chest lat]
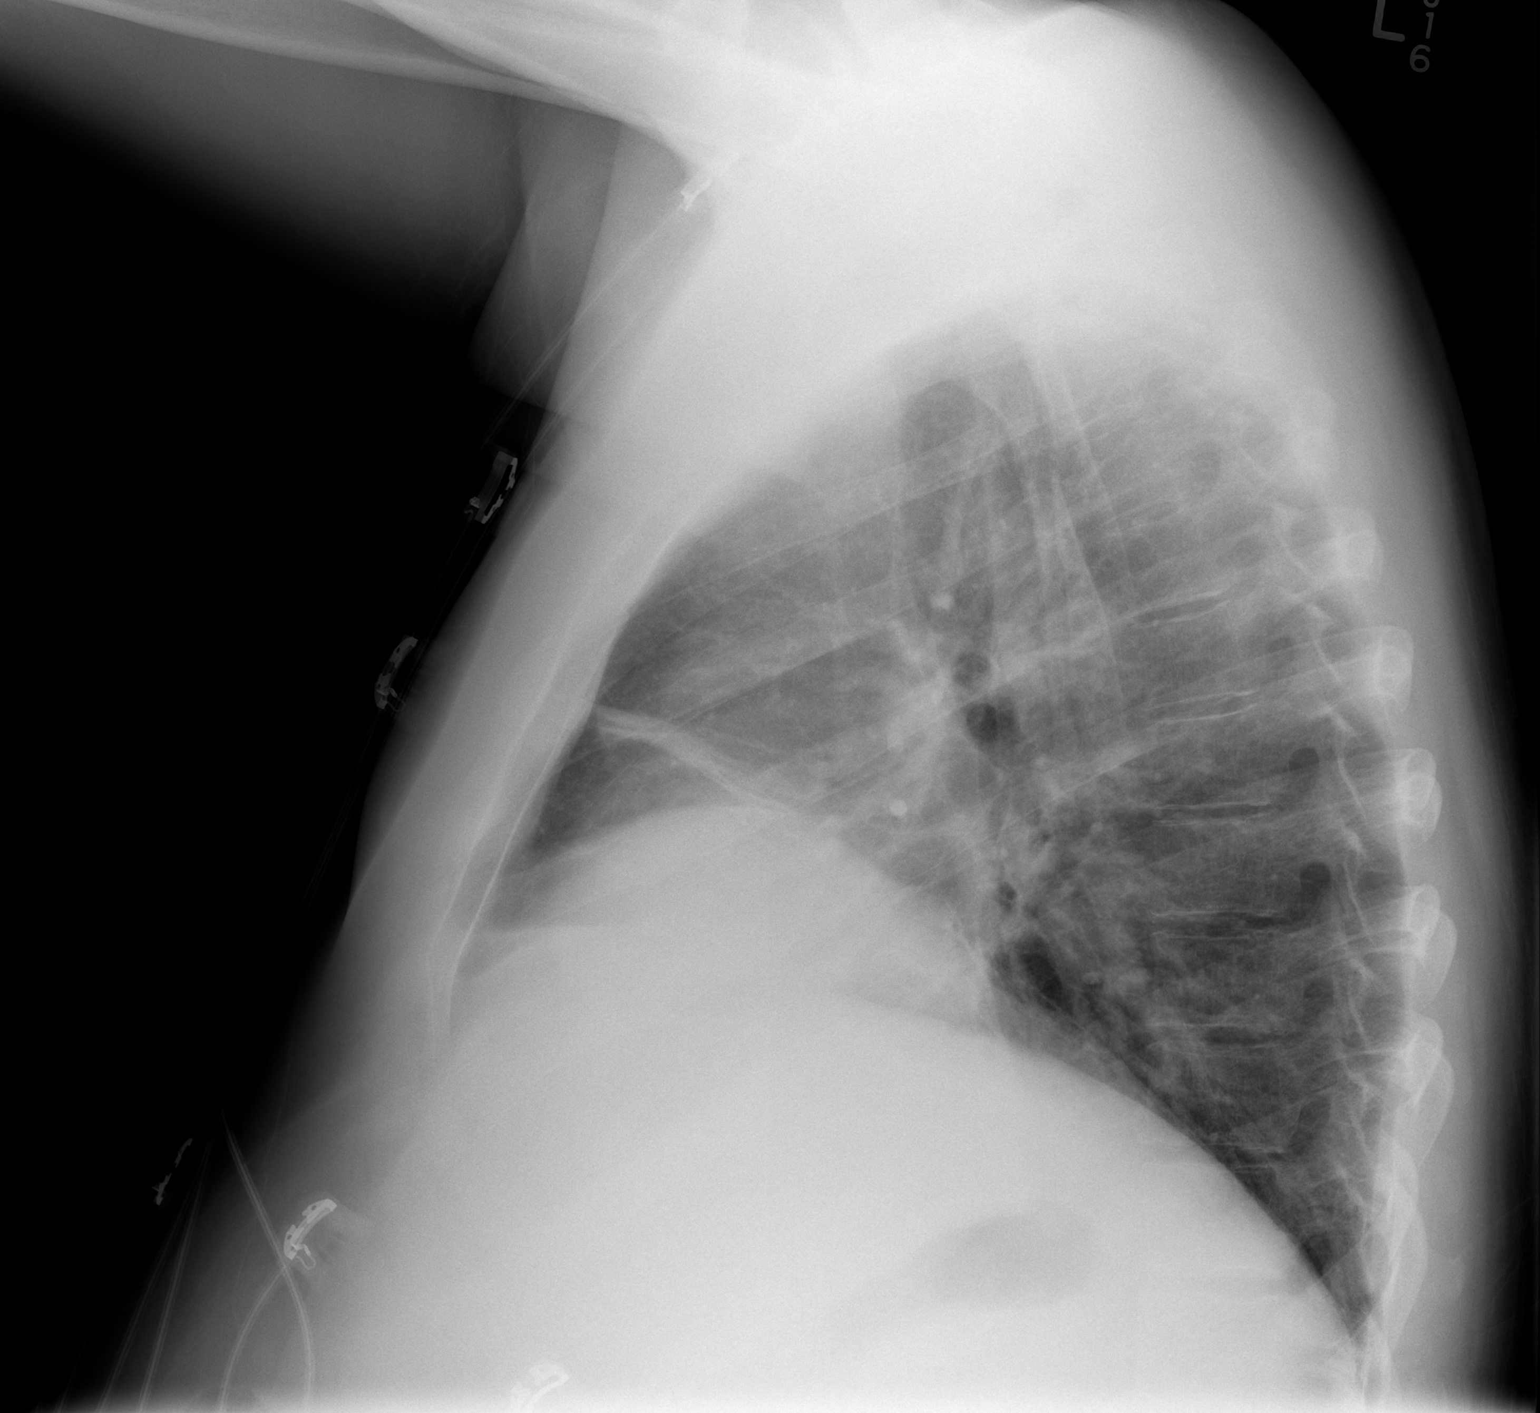

[2 of 2 positions shown; findings below may reference images not displayed]

FINDINGS: Upper normal heart size.

Mediastinal contours and pulmonary vascularity normal.

Subsegmental atelectasis RIGHT base with mild eventration of the
anterior RIGHT diaphragm.

Minimal subsegmental atelectasis LEFT base.

Upper lungs clear.

No pleural effusion or pneumothorax.
IMPRESSION: Bibasilar atelectasis greater on RIGHT.
